# Patient Record
Sex: Male | Born: 1955 | Race: White | Hispanic: No | Marital: Single | Smoking: Never smoker
Health system: Southern US, Community
[De-identification: ages and names within clinical notes are randomized; demographics above are authoritative.]

## PROBLEM LIST (undated history)

## (undated) DIAGNOSIS — C61 Malignant neoplasm of prostate: Secondary | ICD-10-CM

## (undated) DIAGNOSIS — F32A Depression, unspecified: Secondary | ICD-10-CM

## (undated) DIAGNOSIS — I639 Cerebral infarction, unspecified: Secondary | ICD-10-CM

## (undated) DIAGNOSIS — T4145XA Adverse effect of unspecified anesthetic, initial encounter: Secondary | ICD-10-CM

## (undated) DIAGNOSIS — I6930 Unspecified sequelae of cerebral infarction: Secondary | ICD-10-CM

## (undated) DIAGNOSIS — J302 Other seasonal allergic rhinitis: Secondary | ICD-10-CM

## (undated) DIAGNOSIS — I8290 Acute embolism and thrombosis of unspecified vein: Secondary | ICD-10-CM

## (undated) DIAGNOSIS — I10 Essential (primary) hypertension: Secondary | ICD-10-CM

## (undated) DIAGNOSIS — F329 Major depressive disorder, single episode, unspecified: Secondary | ICD-10-CM

## (undated) DIAGNOSIS — N433 Hydrocele, unspecified: Secondary | ICD-10-CM

## (undated) DIAGNOSIS — Z8719 Personal history of other diseases of the digestive system: Secondary | ICD-10-CM

## (undated) DIAGNOSIS — F419 Anxiety disorder, unspecified: Secondary | ICD-10-CM

## (undated) DIAGNOSIS — M1990 Unspecified osteoarthritis, unspecified site: Secondary | ICD-10-CM

## (undated) DIAGNOSIS — T8859XA Other complications of anesthesia, initial encounter: Secondary | ICD-10-CM

## (undated) DIAGNOSIS — I69398 Other sequelae of cerebral infarction: Secondary | ICD-10-CM

## (undated) HISTORY — PX: NO PAST SURGERIES: SHX2092

---
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
## 2006-11-17 DIAGNOSIS — Z86718 Personal history of other venous thrombosis and embolism: Secondary | ICD-10-CM

## 2006-11-17 DIAGNOSIS — I69354 Hemiplegia and hemiparesis following cerebral infarction affecting left non-dominant side: Secondary | ICD-10-CM

## 2006-11-17 DIAGNOSIS — I69319 Unspecified symptoms and signs involving cognitive functions following cerebral infarction: Secondary | ICD-10-CM

## 2006-11-17 HISTORY — DX: Hemiplegia and hemiparesis following cerebral infarction affecting left non-dominant side: I69.354

## 2006-11-17 HISTORY — DX: Unspecified symptoms and signs involving cognitive functions following cerebral infarction: I69.319

## 2006-11-17 HISTORY — DX: Personal history of other venous thrombosis and embolism: Z86.718

## 2009-06-28 ENCOUNTER — Ambulatory Visit (HOSPITAL_COMMUNITY): Admission: RE | Admit: 2009-06-28 | Discharge: 2009-06-28 | Payer: Self-pay | Admitting: Family Medicine

## 2010-11-20 ENCOUNTER — Encounter
Admission: RE | Admit: 2010-11-20 | Discharge: 2010-11-20 | Payer: Self-pay | Source: Home / Self Care | Attending: Family Medicine | Admitting: Family Medicine

## 2012-09-23 ENCOUNTER — Encounter (HOSPITAL_COMMUNITY): Payer: Self-pay | Admitting: Emergency Medicine

## 2012-09-23 ENCOUNTER — Emergency Department (HOSPITAL_COMMUNITY)
Admission: EM | Admit: 2012-09-23 | Discharge: 2012-09-23 | Discharge status: Against Medical Advice | Payer: Medicare Other | Attending: Emergency Medicine | Admitting: Emergency Medicine

## 2012-09-23 DIAGNOSIS — R51 Headache: Secondary | ICD-10-CM | POA: Insufficient documentation

## 2012-09-23 DIAGNOSIS — M542 Cervicalgia: Secondary | ICD-10-CM | POA: Insufficient documentation

## 2012-09-23 HISTORY — DX: Cerebral infarction, unspecified: I63.9

## 2012-09-23 HISTORY — DX: Essential (primary) hypertension: I10

## 2012-09-23 NOTE — ED Notes (Signed)
Pt c/o HA and pressure behind eyes with pain into neck starting at noon today; pt with left sided weakness left over after CVA

## 2012-09-23 NOTE — ED Notes (Signed)
Pt has hx of stroke, c/o "tightening in my temple behind my eye." Denies any other sx at this time.

## 2016-06-17 DIAGNOSIS — Z87898 Personal history of other specified conditions: Secondary | ICD-10-CM

## 2016-06-17 DIAGNOSIS — Z8619 Personal history of other infectious and parasitic diseases: Secondary | ICD-10-CM

## 2016-06-17 HISTORY — DX: Personal history of other specified conditions: Z87.898

## 2016-06-17 HISTORY — DX: Personal history of other infectious and parasitic diseases: Z86.19

## 2016-06-28 ENCOUNTER — Encounter (HOSPITAL_COMMUNITY): Payer: Self-pay | Admitting: Internal Medicine

## 2016-06-28 ENCOUNTER — Inpatient Hospital Stay (HOSPITAL_COMMUNITY): Payer: Medicare Other

## 2016-06-28 ENCOUNTER — Emergency Department (HOSPITAL_COMMUNITY): Payer: Medicare Other

## 2016-06-28 ENCOUNTER — Inpatient Hospital Stay (HOSPITAL_COMMUNITY)
Admission: EM | Admit: 2016-06-28 | Discharge: 2016-07-03 | DRG: 871 | Disposition: A | Discharge status: Skilled Nursing Facility | Payer: Medicare Other | Attending: Internal Medicine | Admitting: Internal Medicine

## 2016-06-28 DIAGNOSIS — I10 Essential (primary) hypertension: Secondary | ICD-10-CM | POA: Diagnosis present

## 2016-06-28 DIAGNOSIS — F32A Depression, unspecified: Secondary | ICD-10-CM | POA: Diagnosis present

## 2016-06-28 DIAGNOSIS — I639 Cerebral infarction, unspecified: Secondary | ICD-10-CM | POA: Diagnosis present

## 2016-06-28 DIAGNOSIS — K219 Gastro-esophageal reflux disease without esophagitis: Secondary | ICD-10-CM | POA: Diagnosis present

## 2016-06-28 DIAGNOSIS — F329 Major depressive disorder, single episode, unspecified: Secondary | ICD-10-CM | POA: Diagnosis present

## 2016-06-28 DIAGNOSIS — Z993 Dependence on wheelchair: Secondary | ICD-10-CM

## 2016-06-28 DIAGNOSIS — N179 Acute kidney failure, unspecified: Secondary | ICD-10-CM | POA: Diagnosis present

## 2016-06-28 DIAGNOSIS — G9340 Encephalopathy, unspecified: Secondary | ICD-10-CM | POA: Diagnosis present

## 2016-06-28 DIAGNOSIS — R4182 Altered mental status, unspecified: Secondary | ICD-10-CM | POA: Diagnosis not present

## 2016-06-28 DIAGNOSIS — N390 Urinary tract infection, site not specified: Secondary | ICD-10-CM | POA: Diagnosis present

## 2016-06-28 DIAGNOSIS — Z7401 Bed confinement status: Secondary | ICD-10-CM

## 2016-06-28 DIAGNOSIS — A419 Sepsis, unspecified organism: Principal | ICD-10-CM | POA: Diagnosis present

## 2016-06-28 DIAGNOSIS — F05 Delirium due to known physiological condition: Secondary | ICD-10-CM | POA: Diagnosis present

## 2016-06-28 DIAGNOSIS — Z79899 Other long term (current) drug therapy: Secondary | ICD-10-CM

## 2016-06-28 DIAGNOSIS — R21 Rash and other nonspecific skin eruption: Secondary | ICD-10-CM | POA: Diagnosis present

## 2016-06-28 DIAGNOSIS — W57XXXA Bitten or stung by nonvenomous insect and other nonvenomous arthropods, initial encounter: Secondary | ICD-10-CM | POA: Diagnosis present

## 2016-06-28 DIAGNOSIS — C61 Malignant neoplasm of prostate: Secondary | ICD-10-CM | POA: Diagnosis present

## 2016-06-28 DIAGNOSIS — E785 Hyperlipidemia, unspecified: Secondary | ICD-10-CM | POA: Diagnosis present

## 2016-06-28 DIAGNOSIS — I69354 Hemiplegia and hemiparesis following cerebral infarction affecting left non-dominant side: Secondary | ICD-10-CM

## 2016-06-28 DIAGNOSIS — R197 Diarrhea, unspecified: Secondary | ICD-10-CM | POA: Diagnosis not present

## 2016-06-28 DIAGNOSIS — A4151 Sepsis due to Escherichia coli [E. coli]: Secondary | ICD-10-CM | POA: Diagnosis not present

## 2016-06-28 DIAGNOSIS — I6300 Cerebral infarction due to thrombosis of unspecified precerebral artery: Secondary | ICD-10-CM

## 2016-06-28 DIAGNOSIS — F419 Anxiety disorder, unspecified: Secondary | ICD-10-CM | POA: Diagnosis present

## 2016-06-28 DIAGNOSIS — R0602 Shortness of breath: Secondary | ICD-10-CM

## 2016-06-28 DIAGNOSIS — F418 Other specified anxiety disorders: Secondary | ICD-10-CM | POA: Diagnosis not present

## 2016-06-28 LAB — CBC WITH DIFFERENTIAL/PLATELET
Basophils Absolute: 0 10*3/uL (ref 0.0–0.1)
Basophils Relative: 0 %
EOS ABS: 0 10*3/uL (ref 0.0–0.7)
EOS PCT: 0 %
HCT: 41.4 % (ref 39.0–52.0)
Hemoglobin: 14.5 g/dL (ref 13.0–17.0)
LYMPHS ABS: 1 10*3/uL (ref 0.7–4.0)
LYMPHS PCT: 6 %
MCH: 28.9 pg (ref 26.0–34.0)
MCHC: 35 g/dL (ref 30.0–36.0)
MCV: 82.5 fL (ref 78.0–100.0)
MONO ABS: 0.9 10*3/uL (ref 0.1–1.0)
MONOS PCT: 6 %
Neutro Abs: 13.3 10*3/uL — ABNORMAL HIGH (ref 1.7–7.7)
Neutrophils Relative %: 88 %
PLATELETS: 151 10*3/uL (ref 150–400)
RBC: 5.02 MIL/uL (ref 4.22–5.81)
RDW: 14 % (ref 11.5–15.5)
WBC: 15.3 10*3/uL — ABNORMAL HIGH (ref 4.0–10.5)

## 2016-06-28 LAB — COMPREHENSIVE METABOLIC PANEL
ALBUMIN: 4.8 g/dL (ref 3.5–5.0)
ALT: 21 U/L (ref 17–63)
AST: 23 U/L (ref 15–41)
Alkaline Phosphatase: 65 U/L (ref 38–126)
Anion gap: 13 (ref 5–15)
BILIRUBIN TOTAL: 1.6 mg/dL — AB (ref 0.3–1.2)
BUN: 23 mg/dL — AB (ref 6–20)
CHLORIDE: 103 mmol/L (ref 101–111)
CO2: 19 mmol/L — ABNORMAL LOW (ref 22–32)
CREATININE: 1.27 mg/dL — AB (ref 0.61–1.24)
Calcium: 9.4 mg/dL (ref 8.9–10.3)
GFR calc Af Amer: >60 mL/min (ref >60)
GFR calc non Af Amer: >60 mL/min (ref >60)
GLUCOSE: 131 mg/dL — AB (ref 65–99)
POTASSIUM: 3.9 mmol/L (ref 3.5–5.1)
Sodium: 135 mmol/L (ref 135–145)
Total Protein: 7.9 g/dL (ref 6.5–8.1)

## 2016-06-28 LAB — URINALYSIS, ROUTINE W REFLEX MICROSCOPIC
GLUCOSE, UA: NEGATIVE mg/dL
KETONES UR: 15 mg/dL — AB
Nitrite: POSITIVE — AB
PH: 5.5 (ref 5.0–8.0)
Protein, ur: >300 mg/dL — AB
Specific Gravity, Urine: 1.035 — ABNORMAL HIGH (ref 1.005–1.030)

## 2016-06-28 LAB — URINE MICROSCOPIC-ADD ON

## 2016-06-28 LAB — APTT: aPTT: 29 seconds (ref 24–36)

## 2016-06-28 LAB — PROTIME-INR
INR: 1.04
PROTHROMBIN TIME: 13.6 s (ref 11.4–15.2)

## 2016-06-28 LAB — PROCALCITONIN: PROCALCITONIN: 0.43 ng/mL

## 2016-06-28 LAB — LACTIC ACID, PLASMA
LACTIC ACID, VENOUS: 0.9 mmol/L (ref 0.5–1.9)
LACTIC ACID, VENOUS: 1.5 mmol/L (ref 0.5–1.9)

## 2016-06-28 LAB — I-STAT CG4 LACTIC ACID, ED: LACTIC ACID, VENOUS: 1.96 mmol/L — AB (ref 0.5–1.9)

## 2016-06-28 MED ORDER — HYDROCODONE-ACETAMINOPHEN 5-325 MG PO TABS
1.0000 | ORAL_TABLET | ORAL | Status: DC | PRN
  Administered 2016-06-28 – 2016-06-29 (×2): 1 via ORAL
  Filled 2016-06-28 (×2): qty 1

## 2016-06-28 MED ORDER — ALBUTEROL SULFATE (2.5 MG/3ML) 0.083% IN NEBU: 2.5000 mg | INHALATION_SOLUTION | RESPIRATORY_TRACT | Status: DC | PRN

## 2016-06-28 MED ORDER — ACETAMINOPHEN 650 MG RE SUPP
650.0000 mg | Freq: Four times a day (QID) | RECTAL | Status: DC | PRN
Start: 2016-06-28 — End: 2016-07-03

## 2016-06-28 MED ORDER — HEPARIN SODIUM (PORCINE) 5000 UNIT/ML IJ SOLN
5000.0000 [IU] | Freq: Three times a day (TID) | INTRAMUSCULAR | Status: DC
  Administered 2016-06-28 – 2016-06-29 (×2): 5000 [IU] via SUBCUTANEOUS
  Filled 2016-06-28 (×2): qty 1

## 2016-06-28 MED ORDER — LABETALOL HCL 200 MG PO TABS
200.0000 mg | ORAL_TABLET | Freq: Two times a day (BID) | ORAL | Status: DC
  Administered 2016-06-28 – 2016-07-03 (×10): 200 mg via ORAL
  Filled 2016-06-28 (×3): qty 1
  Filled 2016-06-28: qty 2
  Filled 2016-06-28 (×7): qty 1
  Filled 2016-06-28: qty 2

## 2016-06-28 MED ORDER — SODIUM CHLORIDE 0.9 % IV BOLUS (SEPSIS)
2000.0000 mL | Freq: Once | INTRAVENOUS | Status: AC
  Administered 2016-06-28: 2000 mL via INTRAVENOUS

## 2016-06-28 MED ORDER — SODIUM CHLORIDE 0.9 % IV SOLN
INTRAVENOUS | Status: AC
  Administered 2016-06-28: 16:00:00 via INTRAVENOUS

## 2016-06-28 MED ORDER — SODIUM CHLORIDE 0.9 % IV BOLUS (SEPSIS)
1000.0000 mL | Freq: Once | INTRAVENOUS | Status: AC
  Administered 2016-06-28: 1000 mL via INTRAVENOUS

## 2016-06-28 MED ORDER — HYDRALAZINE HCL 20 MG/ML IJ SOLN
10.0000 mg | Freq: Four times a day (QID) | INTRAMUSCULAR | Status: DC | PRN
  Administered 2016-07-02: 10 mg via INTRAVENOUS
  Filled 2016-06-28: qty 1

## 2016-06-28 MED ORDER — SODIUM CHLORIDE 0.9% FLUSH
3.0000 mL | Freq: Two times a day (BID) | INTRAVENOUS | Status: DC
  Administered 2016-06-28 – 2016-07-02 (×6): 3 mL via INTRAVENOUS

## 2016-06-28 MED ORDER — MINOXIDIL 2.5 MG PO TABS: 2.5000 mg | ORAL_TABLET | Freq: Two times a day (BID) | ORAL | Status: DC

## 2016-06-28 MED ORDER — CITALOPRAM HYDROBROMIDE 20 MG PO TABS
20.0000 mg | ORAL_TABLET | Freq: Every day | ORAL | Status: DC
  Administered 2016-06-28 – 2016-07-03 (×6): 20 mg via ORAL
  Filled 2016-06-28 (×2): qty 2
  Filled 2016-06-28 (×4): qty 1
  Filled 2016-06-28: qty 2

## 2016-06-28 MED ORDER — ALPRAZOLAM 0.25 MG PO TABS
0.2500 mg | ORAL_TABLET | Freq: Every day | ORAL | Status: DC | PRN
  Administered 2016-07-02: 0.25 mg via ORAL
  Filled 2016-06-28: qty 1

## 2016-06-28 MED ORDER — TAMSULOSIN HCL 0.4 MG PO CAPS
0.4000 mg | ORAL_CAPSULE | Freq: Every day | ORAL | Status: DC
  Administered 2016-06-28 – 2016-07-03 (×6): 0.4 mg via ORAL
  Filled 2016-06-28 (×6): qty 1

## 2016-06-28 MED ORDER — ONDANSETRON HCL 4 MG/2ML IJ SOLN: 4.0000 mg | Freq: Four times a day (QID) | INTRAMUSCULAR | Status: DC | PRN

## 2016-06-28 MED ORDER — PANTOPRAZOLE SODIUM 40 MG PO TBEC
40.0000 mg | DELAYED_RELEASE_TABLET | Freq: Every day | ORAL | Status: DC
  Administered 2016-06-28 – 2016-07-03 (×6): 40 mg via ORAL
  Filled 2016-06-28 (×6): qty 1

## 2016-06-28 MED ORDER — ONDANSETRON HCL 4 MG PO TABS: 4.0000 mg | ORAL_TABLET | Freq: Four times a day (QID) | ORAL | Status: DC | PRN

## 2016-06-28 MED ORDER — DOCUSATE SODIUM 100 MG PO CAPS
200.0000 mg | ORAL_CAPSULE | Freq: Every day | ORAL | Status: DC
  Administered 2016-06-29 – 2016-07-01 (×3): 200 mg via ORAL
  Filled 2016-06-28 (×5): qty 2

## 2016-06-28 MED ORDER — MINOXIDIL 2.5 MG PO TABS
2.5000 mg | ORAL_TABLET | Freq: Two times a day (BID) | ORAL | Status: DC
  Administered 2016-06-29 – 2016-07-03 (×9): 2.5 mg via ORAL
  Filled 2016-06-28 (×9): qty 1

## 2016-06-28 MED ORDER — MIRABEGRON ER 25 MG PO TB24: 25.0000 mg | ORAL_TABLET | Freq: Every day | ORAL | Status: DC

## 2016-06-28 MED ORDER — SIMVASTATIN 40 MG PO TABS
40.0000 mg | ORAL_TABLET | Freq: Every day | ORAL | Status: DC
  Administered 2016-06-28 – 2016-07-03 (×6): 40 mg via ORAL
  Filled 2016-06-28 (×6): qty 1

## 2016-06-28 MED ORDER — ACETAMINOPHEN 325 MG PO TABS
650.0000 mg | ORAL_TABLET | Freq: Four times a day (QID) | ORAL | Status: DC | PRN
  Administered 2016-06-28 – 2016-07-01 (×7): 650 mg via ORAL
  Filled 2016-06-28 (×7): qty 2

## 2016-06-28 MED ORDER — DEXTROSE 5 % IV SOLN
2.0000 g | Freq: Once | INTRAVENOUS | Status: AC
  Administered 2016-06-28: 2 g via INTRAVENOUS
  Filled 2016-06-28: qty 2

## 2016-06-28 MED ORDER — CEFTRIAXONE SODIUM 1 G IJ SOLR
1.0000 g | INTRAMUSCULAR | Status: DC
Start: 2016-06-29 — End: 2016-07-01
  Administered 2016-06-29 – 2016-07-01 (×3): 1 g via INTRAVENOUS
  Filled 2016-06-28 (×3): qty 10

## 2016-06-28 NOTE — ED Notes (Signed)
MD at bedside. EDP PRESENT 

## 2016-06-28 NOTE — ED Notes (Signed)
Attempt for 2 nd IV not successful- will make floor aware

## 2016-06-28 NOTE — ED Notes (Signed)
Bed: WA01 Expected date:  Expected time:  Means of arrival:  Comments:  60 yo UTI?, AMS

## 2016-06-28 NOTE — H&P (Signed)
TRH H&P   Patient Demographics:    Bryan Bartlett, is a 60 y.o. male  MRN: [id number]   DOB - 1956-04-01  Admit Date - 06/28/2016  Outpatient Primary MD for the patient is No primary care provider on file.  Outpatient Specialists: [facility]  Patient coming from: Home  Chief Complaint  Patient presents with  . Urinary Tract Infection  . Fever  . Altered Mental Status      HPI:    Bryan Bartlett  is a 60 y.o. 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  The ER diagnosed with sepsis due to UTI, patient's review of systems besides above is entirely unremarkable, I was called to admit him for sepsis treatment.    Review of systems:    In addition to the HPI above,   Positive Fever-chills, No Headache, No changes with Vision or hearing, No problems swallowing food or Liquids, No Chest pain, Cough or Shortness of Breath, No Abdominal pain, No Nausea or Vommitting, Bowel movements are regular, No Blood in stool or Urine, Positive dysuria, No new skin rashes or bruises, No new joints pains-aches,  No new weakness, tingling, numbness in any extremity, No recent weight gain or loss, No polydypsia or polyphagia, No significant Mental Stressors.  A full 10 point Review of Systems was done, except as stated above, all other Review of Systems were negative.   With Past History of the following :     Past Medical History:  Diagnosis Date  . Hypertension   . Stroke [facility])       No past surgical history on file.    Social History:     Social History  Substance Use Topics  . Smoking status: Never Smoker  . Smokeless tobacco: Not on file  . Alcohol use No         Family History :   No history of prostate cancer   Home Medications:   Prior to Admission medications   Medication Sig Start Date End Date Taking? Authorizing Provider  ALPRAZolam Duanne Moron) 0.5 MG tablet Take 0.25  mg by mouth daily as needed for anxiety.  05/27/16  Yes Historical Provider, MD  cetirizine (ZYRTEC) 10 MG tablet Take 10 mg by mouth daily.   Yes Historical Provider, MD  citalopram (CELEXA) 20 MG tablet Take 20 mg by mouth daily. 05/27/16  Yes Historical Provider, MD  Docusate Calcium (STOOL SOFTENER PO) Take 1 tablet by mouth daily.   Yes Historical Provider, MD  furosemide (LASIX) 20 MG tablet Take 20 mg by mouth daily. 05/27/16  Yes Historical Provider, MD  labetalol (NORMODYNE) 200 MG tablet Take 400 mg by mouth 2 (two) times daily. 06/13/16  Yes Historical Provider, MD  lisinopril (PRINIVIL,ZESTRIL) 40 MG tablet Take 80 mg by mouth daily. 06/02/16  Yes Historical Provider, MD  methylphenidate (RITALIN) 10 MG tablet Take 10 mg by mouth 2 (two) times daily. 06/24/16  Yes Historical Provider, MD  minoxidil (LONITEN) 10 MG tablet Take 10 mg by mouth 2 (two) times daily. 05/27/16  Yes Historical Provider, MD  Multiple Vitamins-Minerals (MULTIVITAMIN ADULT PO) Take 1 tablet by mouth daily.   Yes Historical Provider, MD  MYRBETRIQ 25 MG TB24 tablet Take 25 mg by mouth daily. 06/09/16  Yes Historical Provider, MD  omeprazole (PRILOSEC) 20 MG capsule Take 20 mg by mouth daily. 06/07/16  Yes Historical Provider, MD  simvastatin (ZOCOR) 40 MG tablet Take 40 mg by mouth daily. 06/25/16  Yes Historical Provider, MD     Allergies:    No Known Allergies   Physical Exam:   Vitals  Blood pressure 115/76, pulse 93,  temperature 102.1 F (38.9 C), temperature source Rectal, resp. rate 22, height 5\' 8"  (1.727 m), weight 65.3 kg (144 lb), SpO2 100 %.   1. General Is an middle-aged white male lying in bed in NAD,    2. Normal affect and insight, Not Suicidal or Homicidal, Awake Alert, Oriented X 3.  3. No F.N deficits, ALL C.Nerves Intact, Strength 5/5 in right-sided extremities, strength 2/5 on the left side, Sensation intact all 4 extremities, Plantars down going on the right.  4. Ears and Eyes appear Normal, Conjunctivae clear, PERRLA. Moist Oral Mucosa.  5. Supple Neck, No JVD, No cervical lymphadenopathy appriciated, No Carotid Bruits.  6. Symmetrical Chest wall movement, Good air movement bilaterally, CTAB.  7. RRR, No Gallops, Rubs or Murmurs, No Parasternal Heave.  8. Positive Bowel Sounds, Abdomen Soft, No tenderness, No organomegaly appriciated,No rebound -guarding or rigidity.  9.  No Cyanosis, Normal Skin Turgor, No Skin Rash or Bruise.  10. Good muscle tone,  joints appear normal , no effusions, Normal ROM.  11. No Palpable Lymph Nodes in Neck or Axillae      Data Review:    CBC  Recent Labs Lab 06/28/16 1115  WBC 15.3*  HGB 14.5  HCT 41.4  PLT 151  MCV 82.5  MCH 28.9  MCHC 35.0  RDW 14.0  LYMPHSABS 1.0  MONOABS 0.9  EOSABS 0.0  BASOSABS 0.0   ------------------------------------------------------------------------------------------------------------------  Chemistries   Recent Labs Lab 06/28/16 1115  NA 135  K 3.9  CL 103  CO2 19*  GLUCOSE 131*  BUN 23*  CREATININE 1.27*  CALCIUM 9.4  AST 23  ALT 21  ALKPHOS 65  BILITOT 1.6*   ------------------------------------------------------------------------------------------------------------------ estimated creatinine clearance is 57.8 mL/min (by C-G formula based on SCr of 1.27 mg/dL). ------------------------------------------------------------------------------------------------------------------ No  results for input(s): TSH, T4TOTAL, T3FREE, THYROIDAB in the last 72 hours.  Invalid input(s): FREET3  Coagulation profile No results for input(s): INR, PROTIME in the  last 168 hours. ------------------------------------------------------------------------------------------------------------------- No results for input(s): DDIMER in the last 72 hours. -------------------------------------------------------------------------------------------------------------------  Cardiac Enzymes No results for input(s): CKMB, TROPONINI, MYOGLOBIN in the last 168 hours.  Invalid input(s): CK ------------------------------------------------------------------------------------------------------------------ No results found for: BNP   ---------------------------------------------------------------------------------------------------------------  Urinalysis    Component Value Date/Time   COLORURINE RED (A) 06/28/2016 1310   APPEARANCEUR TURBID (A) 06/28/2016 1310   LABSPEC 1.035 (H) 06/28/2016 1310   PHURINE 5.5 06/28/2016 1310   GLUCOSEU NEGATIVE 06/28/2016 1310   HGBUR LARGE (A) 06/28/2016 1310   BILIRUBINUR MODERATE (A) 06/28/2016 1310   KETONESUR 15 (A) 06/28/2016 1310   PROTEINUR >300 (A) 06/28/2016 1310   NITRITE POSITIVE (A) 06/28/2016 1310   LEUKOCYTESUR MODERATE (A) 06/28/2016 1310    ----------------------------------------------------------------------------------------------------------------   Imaging Results:    Dg Chest 2 View  Result Date: 06/28/2016 CLINICAL DATA:  Per [facility]- Pt resides at home. Pt presents with altered mental status- not normal. Pt with UTI symptoms started yesterday. HX of over active bladder, Frequency, hematuria dysuria This am. Fever 101.1 tympanic for EMS. EXAM: CHEST  2 VIEW COMPARISON:  None. FINDINGS: Cardiac silhouette is mildly enlarged. No mediastinal or hilar masses.  No evidence of adenopathy. Lung volumes are low. Allowing for this, lungs are  clear. No pleural effusion or pneumothorax. Skeletal structures are unremarkable. IMPRESSION: No active cardiopulmonary disease. Electronically Signed   By: Lajean Manes M.D.   On: 06/28/2016 12:18    Baseline EKG ordered   Assessment & Plan:     1. Sepsis due to UTI in a patient with prostate cancer being managed conservatively. Will be admitted to telemetry bed, sepsis protocol initiated, cultures drawn, IV fluid bolus and maintenance, will place him empirically on Flomax, IV Rocephin and then guided by cultures. Post void bladder scan in the ER was 100 mL, will repeat renal ultrasound later to rule out any ongoing urinary retention.  2. Prostate cancer. Under the care of [facility] in control. Outpatient follow-up with them after discharge.  3. Severe essential hypertension. Blood pressure currently soft, will give him half home dose labetalol from this evening with holding parameters, one fourth does office minoxidil from tomorrow, hold Prinivil, as needed IV hydralazine, reevaluate blood pressure medications in the morning.  4. CVA with left-sided hemiparesis. Continue statin for secondary prevention, supportive care. He is largely wheelchair and bedbound.  5. Dyslipidemia. Continue statin.  6. Anxiety and depression. Celexa and Xanax continued.  7. Stable mild allergy with small skin rash on the left thigh with my Bertie. Stop.  8. GERD. On PPI.    DVT Prophylaxis Heparin    AM Labs Ordered, also please review Full Orders  Family Communication: Admission, patients condition and plan of care including tests being ordered have been discussed with the patient and family who indicate understanding and agree with the plan and Code Status.  Code Status Full  Likely DC to  Home 1-2 days  Condition GUARDED    Consults called: None    Admission status: Inpt    Time spent in minutes : 35   SINGH,PRASHANT K M.D on 06/28/2016 at 2:49 PM  Between 7am to 7pm - Pager -  [phone]. After 7pm go to [email or web address] - password [facility]  [facility] - Office  [phone]

## 2016-06-28 NOTE — Progress Notes (Signed)
Pharmacy Antibiotic Note  Bryan Bartlett is a 60 y.o. male admitted on 06/28/2016 with UTI.  Pharmacy has been consulted for ceftriaxone dosing.  Plan: Ceftriaxone 2gm IV x 1 in ED then Ceftriaxone 1gm IV q24h No renal adjustment required, pharmacy will sign off Please re-consult if needed  Height: 5\' 8"  (172.7 cm) Weight: 144 lb (65.3 kg) IBW/kg (Calculated) : 68.4  Temp (24hrs), Avg:102.1 F (38.9 C), Min:102.1 F (38.9 C), Max:102.1 F (38.9 C)   Recent Labs Lab 06/28/16 1115 06/28/16 1124  WBC 15.3*  --   LATICACIDVEN  --  1.96*    CrCl cannot be calculated (No order found.).    No Known Allergies  Antimicrobials this admission: 8/12 ceftriaxone >>  Microbiology results: 8/12 BCx:  8/12 UCx:  Thank you for allowing pharmacy to be a part of this patient's care.  Dolly Rias RPh 06/28/2016, 12:08 PM Pager [phone]

## 2016-06-28 NOTE — ED Notes (Signed)
Admission MD present. Family present

## 2016-06-28 NOTE — ED Notes (Signed)
Renal Ultrasound at bedside. Delay in transfer

## 2016-06-28 NOTE — ED Notes (Signed)
EKG PERFORMED PER ORDER

## 2016-06-28 NOTE — ED Notes (Signed)
Condom cath- placed [place] NT for comfort

## 2016-06-28 NOTE — ED Notes (Signed)
Bryan [id number] [phone] cousin

## 2016-06-28 NOTE — ED Notes (Addendum)
Blood culture x 1 obtain 11:20 32ml LAC

## 2016-06-28 NOTE — ED Notes (Signed)
Attempted to in and out cath, was unsuccessful

## 2016-06-28 NOTE — ED Triage Notes (Signed)
Per [facility]- Pt resides at home. Pt presents with altered mental status- not normal. Pt with UTI symptoms started yesterday. HX of over active bladder, Frequency,  hematuria dysuria  This am. Fever 101.1 tympanic for EMS. Tylenol 1000mg  PO. Given on scene. Rash to groin. Benadryl 50mg  PO on scene

## 2016-06-28 NOTE — ED Provider Notes (Signed)
[facility] DEPT Provider Note   CSN: [id number] Arrival date & time: 06/28/16  1018  First Provider Contact:  None       History   Chief Complaint Chief Complaint  Patient presents with  . Urinary Tract Infection  . Fever  . Altered Mental Status    HPI Bryan Bartlett is a 60 y.o. male.  60 year old male with history of stroke as well as UTIs presents with dysuria and confusion 1 day. Similar symptoms occur when he's had a UTI. Called EMS and temperature was 101 and he was medicated with Tylenol 1 g. Denies any cough or congestion. No vomiting or diarrhea. No abdominal or chest discomfort. No flank pain. Does note some suprapubic tenderness. He does wear a diaper. Has had a rash to his left anterior thigh which is been pleuritic and related systems starting a new medications. He denies any trouble swallowing      Past Medical History:  Diagnosis Date  . Hypertension   . Stroke     There are no active problems to display for this patient.   No past surgical history on file.     Home Medications    Prior to Admission medications   Not on File    Family History No family history on file.  Social History Social History  Substance Use Topics  . Smoking status: Never Smoker  . Smokeless tobacco: Not on file  . Alcohol use No     Allergies   Review of patient's allergies indicates no known allergies.   Review of Systems Review of Systems  All other systems reviewed and are negative.    Physical Exam Updated Vital Signs SpO2 99%   Physical Exam  Constitutional: He is oriented to person, place, and time. He appears well-developed and well-nourished.  Non-toxic appearance. No distress.  HENT:  Head: Normocephalic and atraumatic.  Eyes: Conjunctivae, EOM and lids are normal. Pupils are equal, round, and reactive to light.  Neck: Normal range of motion. Neck supple. No tracheal deviation present. No thyroid mass present.  Cardiovascular:  Normal rate, regular rhythm and normal heart sounds.  Exam reveals no gallop.   No murmur heard. Pulmonary/Chest: Effort normal and breath sounds normal. No stridor. No respiratory distress. He has no decreased breath sounds. He has no wheezes. He has no rhonchi. He has no rales.  Abdominal: Soft. Normal appearance and bowel sounds are normal. He exhibits no distension. There is tenderness in the suprapubic area. There is no rigidity, no rebound, no guarding and no CVA tenderness.  Musculoskeletal: Normal range of motion. He exhibits no edema or tenderness.  Neurological: He is alert and oriented to person, place, and time. He displays no tremor. No cranial nerve deficit or sensory deficit. He displays no seizure activity. GCS eye subscore is 4. GCS verbal subscore is 5. GCS motor subscore is 6.  Skin: Skin is warm and dry. No abrasion and no rash noted.  Psychiatric: His speech is normal and behavior is normal. His affect is blunt.  Nursing note and vitals reviewed.    ED Treatments / Results  Labs (all labs ordered are listed, but only abnormal results are displayed) Labs Reviewed  CULTURE, BLOOD (ROUTINE X 2)  CULTURE, BLOOD (ROUTINE X 2)  URINE CULTURE  COMPREHENSIVE METABOLIC PANEL  CBC WITH DIFFERENTIAL/PLATELET  URINALYSIS, ROUTINE W REFLEX MICROSCOPIC (NOT AT [facility])  I-STAT CG4 LACTIC ACID, ED    EKG  EKG Interpretation None  Radiology No results found.  Procedures Procedures (including critical care time)  Medications Ordered in ED Medications - No data to display   Initial Impression / Assessment and Plan / ED Course  I have reviewed the triage vital signs and the nursing notes.  Pertinent labs & imaging results that were available during my care of the patient were reviewed by me and considered in my medical decision making (see chart for details).  Clinical Course    Patient started on IV antibiotics for his urinary tract infection. Given IV fluids as  well 2. Will be admitted to the telemetry service by the hospitalist  Final Clinical Impressions(s) / ED Diagnoses   Final diagnoses:  None    New Prescriptions New Prescriptions   No medications on file     Lacretia Leigh, MD 06/28/16 1453

## 2016-06-28 NOTE — ED Notes (Signed)
No medications on this floor for this pt

## 2016-06-29 DIAGNOSIS — K219 Gastro-esophageal reflux disease without esophagitis: Secondary | ICD-10-CM | POA: Diagnosis present

## 2016-06-29 DIAGNOSIS — I6300 Cerebral infarction due to thrombosis of unspecified precerebral artery: Secondary | ICD-10-CM

## 2016-06-29 DIAGNOSIS — F418 Other specified anxiety disorders: Secondary | ICD-10-CM

## 2016-06-29 DIAGNOSIS — C61 Malignant neoplasm of prostate: Secondary | ICD-10-CM

## 2016-06-29 DIAGNOSIS — I10 Essential (primary) hypertension: Secondary | ICD-10-CM

## 2016-06-29 DIAGNOSIS — F32A Depression, unspecified: Secondary | ICD-10-CM | POA: Diagnosis present

## 2016-06-29 DIAGNOSIS — F419 Anxiety disorder, unspecified: Secondary | ICD-10-CM

## 2016-06-29 DIAGNOSIS — E785 Hyperlipidemia, unspecified: Secondary | ICD-10-CM

## 2016-06-29 DIAGNOSIS — F329 Major depressive disorder, single episode, unspecified: Secondary | ICD-10-CM | POA: Diagnosis present

## 2016-06-29 LAB — CBC
HEMATOCRIT: 35.6 % — AB (ref 39.0–52.0)
HEMOGLOBIN: 12.6 g/dL — AB (ref 13.0–17.0)
MCH: 29.5 pg (ref 26.0–34.0)
MCHC: 35.4 g/dL (ref 30.0–36.0)
MCV: 83.4 fL (ref 78.0–100.0)
Platelets: 132 10*3/uL — ABNORMAL LOW (ref 150–400)
RBC: 4.27 MIL/uL (ref 4.22–5.81)
RDW: 14.5 % (ref 11.5–15.5)
WBC: 18.8 10*3/uL — ABNORMAL HIGH (ref 4.0–10.5)

## 2016-06-29 LAB — BASIC METABOLIC PANEL
ANION GAP: 8 (ref 5–15)
BUN: 18 mg/dL (ref 6–20)
CO2: 22 mmol/L (ref 22–32)
Calcium: 7.9 mg/dL — ABNORMAL LOW (ref 8.9–10.3)
Chloride: 108 mmol/L (ref 101–111)
Creatinine, Ser: 1.14 mg/dL (ref 0.61–1.24)
GFR calc non Af Amer: >60 mL/min (ref >60)
GLUCOSE: 106 mg/dL — AB (ref 65–99)
POTASSIUM: 3.9 mmol/L (ref 3.5–5.1)
Sodium: 138 mmol/L (ref 135–145)

## 2016-06-29 LAB — MAGNESIUM: Magnesium: 1.4 mg/dL — ABNORMAL LOW (ref 1.7–2.4)

## 2016-06-29 MED ORDER — HYDROCORTISONE 1 % EX CREA
TOPICAL_CREAM | Freq: Three times a day (TID) | CUTANEOUS | Status: DC
  Administered 2016-06-29 – 2016-07-03 (×12): via TOPICAL
  Filled 2016-06-29: qty 28

## 2016-06-29 MED ORDER — ENOXAPARIN SODIUM 40 MG/0.4ML ~~LOC~~ SOLN
40.0000 mg | SUBCUTANEOUS | Status: DC
  Administered 2016-06-29 – 2016-07-02 (×4): 40 mg via SUBCUTANEOUS
  Filled 2016-06-29 (×4): qty 0.4

## 2016-06-29 NOTE — Progress Notes (Signed)
Patient's sister states patient's urologist is Dr Baruch Gouty with [facility]. Eulas Post, RN

## 2016-06-29 NOTE — Progress Notes (Addendum)
Progress Note    Bryan Bartlett  [id number] DOB: September 04, 1956  DOA: 06/28/2016 PCP: No primary care provider on file.    Brief Narrative:   Bryan Bartlett is an 60 y.o. male with a PMH of CVA/left hemiparesis and wheelchair-bound status, prostate cancer which is being managed conservatively with monitoring, poorly controlled high blood pressure, anxiety, dyslipidemia and GERD who presented to the hospital 06/28/16 for evaluation of dysuria, fever and chills. Patient was found to have sepsis secondary to UTI.  Assessment/Plan:   Principal Problem:   Sepsis ([place]) secondary to UTI complicated by urinary retention in the setting of prostate cancer Sepsis order set utilized. The patient received appropriate IV fluids with bolus in the ED. Serum lactate initially elevated, cleared with IV fluids. Urine culture/blood culture sent. IV Rocephin empirically started. Flomax was also started to address his urinary retention. He was noted to have a post void bladder residual of 100 mL. Renal ultrasound negative for hydronephrosis. He will need to follow-up with urologist post discharge.  Active Problems:   Poorly controlled essential hypertension The patient's blood pressure was soft on admission, consistent with sepsis. Blood pressure has improved with fluid volume resuscitation. Gradually resume antihypertensives.    History of CVA with left-sided hemiparesis Continue secondary prevention.    Dyslipidemia Continue statin.    Depression and anxiety Continue Celexa and Xanax.    GERD Continue PPI.  Family Communication/Anticipated D/C date and plan/Code Status   DVT prophylaxis: Lovenox ordered. Code Status: Full Code.  Family Communication: No family at bedside. Disposition Plan: Home in 48-72 hours.   Medical Consultants:    None.   Procedures:    None  Anti-Infectives:   Rocephin 06/28/16--->  Subjective:    Bryan Bartlett has had some nausea but no  vomiting, no complaints of pain, and no dyspnea. He continues to have fevers.  Objective:    Vitals:   06/28/16 1745 06/28/16 2039 06/29/16 0018 06/29/16 0432  BP: 140/62 140/68 132/70 125/72  Pulse: (!) 104 (!) 112  99  Resp: (!) 24 18 18 18   Temp: (!) 103.4 F (39.7 C) (!) 101.8 F (38.8 C) 98.7 F (37.1 C) (!) 100.5 F (38.1 C)  TempSrc: Rectal Oral Oral Oral  SpO2: 97% 97% 97% 96%  Weight:    89.4 kg (197 lb)  Height:        Intake/Output Summary (Last 24 hours) at 06/29/16 0852 Last data filed at 06/29/16 0645  Gross per 24 hour  Intake           8362.5 ml  Output              275 ml  Net           8087.5 ml   Filed Weights   06/28/16 1043 06/29/16 0432  Weight: 65.3 kg (144 lb) 89.4 kg (197 lb)    Exam: General exam: Appears calm and comfortable.  Respiratory system: Clear to auscultation. Respiratory effort normal. Cardiovascular system: S1 & S2 heard, Tachycardic. No JVD,  rubs, gallops or clicks. No murmurs. Gastrointestinal system: Abdomen is nondistended, soft and nontender. No organomegaly or masses felt. Normal bowel sounds heard. Central nervous system: Alert and oriented. No focal neurological deficits. Extremities: No clubbing,  or cyanosis. No edema. Skin: No rashes, lesions or ulcers. Psychiatry: Judgement and insight appear normal. Mood & affect appropriate.   Data Reviewed:   I have personally reviewed following labs and imaging studies:  Labs: Basic  Metabolic Panel:  Recent Labs Lab 06/28/16 1115 06/29/16 0454  NA 135 138  K 3.9 3.9  CL 103 108  CO2 19* 22  GLUCOSE 131* 106*  BUN 23* 18  CREATININE 1.27* 1.14  CALCIUM 9.4 7.9*  MG  --  1.4*   GFR Estimated Creatinine Clearance: 75.8 mL/min (by C-G formula based on SCr of 1.14 mg/dL). Liver Function Tests:  Recent Labs Lab 06/28/16 1115  AST 23  ALT 21  ALKPHOS 65  BILITOT 1.6*  PROT 7.9  ALBUMIN 4.8   No results for input(s): LIPASE, AMYLASE in the last 168 hours. No  results for input(s): AMMONIA in the last 168 hours. Coagulation profile  Recent Labs Lab 06/28/16 1115  INR 1.04    CBC:  Recent Labs Lab 06/28/16 1115 06/29/16 0454  WBC 15.3* 18.8*  NEUTROABS 13.3*  --   HGB 14.5 12.6*  HCT 41.4 35.6*  MCV 82.5 83.4  PLT 151 132*   Sepsis Labs:  Recent Labs Lab 06/28/16 1115 06/28/16 1124 06/28/16 1644 06/28/16 1847 06/29/16 0454  PROCALCITON 0.43  --   --   --   --   WBC 15.3*  --   --   --  18.8*  LATICACIDVEN  --  1.96* 0.9 1.5  --     Microbiology Recent Results (from the past 240 hour(s))  Blood Culture (routine x 2)     Status: None (Preliminary result)   Collection Time: 06/28/16 11:15 AM  Result Value Ref Range Status   Specimen Description   Final    BLOOD RIGHT ANTECUBITAL Performed at [facility]    Special Requests NONE  Final   Culture PENDING  Incomplete   Report Status PENDING  Incomplete  Blood Culture (routine x 2)     Status: None (Preliminary result)   Collection Time: 06/28/16 11:20 AM  Result Value Ref Range Status   Specimen Description   Final    BLOOD LEFT ANTECUBITAL Performed at [facility]    Special Requests NONE  Final   Culture PENDING  Incomplete   Report Status PENDING  Incomplete    Radiology: Dg Chest 2 View  Result Date: 06/28/2016 CLINICAL DATA:  Per [facility]- Pt resides at home. Pt presents with altered mental status- not normal. Pt with UTI symptoms started yesterday. HX of over active bladder, Frequency, hematuria dysuria This am. Fever 101.1 tympanic for EMS. EXAM: CHEST  2 VIEW COMPARISON:  None. FINDINGS: Cardiac silhouette is mildly enlarged. No mediastinal or hilar masses.  No evidence of adenopathy. Lung volumes are low. Allowing for this, lungs are clear. No pleural effusion or pneumothorax. Skeletal structures are unremarkable. IMPRESSION: No active cardiopulmonary disease. Electronically Signed   By: Lajean Manes M.D.   On: 06/28/2016 12:18   US  Renal  Result Date: 06/28/2016 CLINICAL DATA:  Acute renal failure EXAM: RENAL / URINARY TRACT ULTRASOUND COMPLETE COMPARISON:  None. FINDINGS: Right Kidney: Length: 10.3 cm. Echogenicity within normal limits. No mass or hydronephrosis visualized. Left Kidney: Length: 12.4 cm. Echogenicity within normal limits. No mass or hydronephrosis visualized. Bladder: Appears normal for degree of bladder distention. IMPRESSION: No acute abnormality noted. Electronically Signed   By: Inez Catalina M.D.   On: 06/28/2016 17:55    Medications:   . cefTRIAXone (ROCEPHIN)  IV  1 g Intravenous Q24H  . citalopram  20 mg Oral Daily  . docusate sodium  200 mg Oral Daily  . heparin  5,000 Units Subcutaneous Q8H  .  labetalol  200 mg Oral BID  . minoxidil  2.5 mg Oral BID  . pantoprazole  40 mg Oral Daily  . simvastatin  40 mg Oral Daily  . sodium chloride flush  3 mL Intravenous Q12H  . tamsulosin  0.4 mg Oral Daily   Continuous Infusions: . sodium chloride 125 mL/hr at 06/28/16 2300    Time spent: 35 minutes.  The patient is medically complex with multiple co-morbidities and is at high risk for clinical deterioration and requires high complexity decision making.    LOS: 1 day   Carlen Rebuck  [facility] Pager [phone]. If unable to reach me by pager, please call my cell phone at [phone].  *Please refer to [email or web address], password [email or web address] to get updated schedule on who will round on this patient, as hospitalists switch teams weekly. If 7PM-7AM, please contact night-coverage at [email or web address], password [email or web address] for any overnight needs.  06/29/2016, 8:52 AM

## 2016-06-30 DIAGNOSIS — W57XXXA Bitten or stung by nonvenomous insect and other nonvenomous arthropods, initial encounter: Secondary | ICD-10-CM

## 2016-06-30 LAB — BASIC METABOLIC PANEL
ANION GAP: 6 (ref 5–15)
BUN: 17 mg/dL (ref 6–20)
CHLORIDE: 108 mmol/L (ref 101–111)
CO2: 21 mmol/L — AB (ref 22–32)
Calcium: 8.1 mg/dL — ABNORMAL LOW (ref 8.9–10.3)
Creatinine, Ser: 1.05 mg/dL (ref 0.61–1.24)
GFR calc Af Amer: >60 mL/min (ref >60)
GFR calc non Af Amer: >60 mL/min (ref >60)
GLUCOSE: 116 mg/dL — AB (ref 65–99)
POTASSIUM: 3.5 mmol/L (ref 3.5–5.1)
Sodium: 135 mmol/L (ref 135–145)

## 2016-06-30 LAB — CBC
HEMATOCRIT: 35.4 % — AB (ref 39.0–52.0)
HEMOGLOBIN: 12.4 g/dL — AB (ref 13.0–17.0)
MCH: 29 pg (ref 26.0–34.0)
MCHC: 35 g/dL (ref 30.0–36.0)
MCV: 82.7 fL (ref 78.0–100.0)
PLATELETS: 122 10*3/uL — AB (ref 150–400)
RBC: 4.28 MIL/uL (ref 4.22–5.81)
RDW: 14.6 % (ref 11.5–15.5)
WBC: 16.1 10*3/uL — ABNORMAL HIGH (ref 4.0–10.5)

## 2016-06-30 LAB — URINE CULTURE

## 2016-06-30 MED ORDER — MAGNESIUM SULFATE 2 GM/50ML IV SOLN
2.0000 g | Freq: Once | INTRAVENOUS | Status: AC
  Administered 2016-06-30: 2 g via INTRAVENOUS
  Filled 2016-06-30: qty 50

## 2016-06-30 MED ORDER — DOXYCYCLINE HYCLATE 100 MG PO TABS
100.0000 mg | ORAL_TABLET | Freq: Two times a day (BID) | ORAL | Status: DC
  Administered 2016-06-30 – 2016-07-02 (×5): 100 mg via ORAL
  Filled 2016-06-30 (×5): qty 1

## 2016-06-30 MED ORDER — POLYVINYL ALCOHOL 1.4 % OP SOLN
1.0000 [drp] | OPHTHALMIC | Status: DC | PRN
  Filled 2016-06-30: qty 15

## 2016-06-30 NOTE — Progress Notes (Signed)
Progress Note    Bryan Bartlett  [id number] DOB: 10-17-1956  DOA: 06/28/2016 PCP: No primary care provider on file.    Brief Narrative:   Bryan Bartlett is an 60 y.o. male with a PMH of CVA/left hemiparesis and wheelchair-bound status, prostate cancer which is being managed conservatively with monitoring, poorly controlled high blood pressure, anxiety, dyslipidemia and GERD who presented to the hospital 06/28/16 for evaluation of dysuria, fever and chills. Patient was found to have sepsis secondary to UTI.  Assessment/Plan:   Principal Problem:   Sepsis ([place]) secondary to UTI complicated by urinary retention in the setting of prostate cancer Sepsis order set utilized. The patient received appropriate IV fluids with bolus in the ED. Serum lactate initially elevated, cleared with IV fluids. Urine culture/blood culture sent. IV Rocephin empirically started. Flomax was also started to address his urinary retention. He was noted to have a post void bladder residual of 100 mL. Renal ultrasound negative for hydronephrosis. He will need to follow-up with urologist post discharge.    Tick bite Given ongoing fevers, will add doxycycline and check [facility] spotted fever titers and Lyme disease PCR studies.  Active Problems:   Poorly controlled essential hypertension The patient's blood pressure was soft on admission, consistent with sepsis. Blood pressure has improved with fluid volume resuscitation. Gradually resume antihypertensives.    History of CVA with left-sided hemiparesis Continue secondary prevention.    Dyslipidemia Continue statin.    Depression and anxiety Continue Celexa and Xanax.    GERD Continue PPI.  Family Communication/Anticipated D/C date and plan/Code Status   DVT prophylaxis: Lovenox ordered. Code Status: Full Code.  Family Communication: No family at bedside. Disposition Plan: Home when his fever resolves and his antibiotics can successfully  be transitioned to oral, likely another 48-72 hours.   Medical Consultants:    None   Procedures:    None  Anti-Infectives:   Rocephin 06/28/16--->  Subjective:    Bryan Bartlett has had some nausea but no vomiting, no complaints of pain, and no dyspnea. He continues to have fevers.  Objective:    Vitals:   06/29/16 2139 06/30/16 0506 06/30/16 0900 06/30/16 1000  BP:  137/81  (!) (P) 157/86  Pulse:  90  (P) 90  Resp:  18    Temp: (!) 100.7 F (38.2 C) (!) 100.8 F (38.2 C) (!) 101.1 F (38.4 C)   TempSrc: Rectal Rectal Oral   SpO2:  97%    Weight:  90.5 kg (199 lb 9.6 oz)    Height:        Intake/Output Summary (Last 24 hours) at 06/30/16 1052 Last data filed at 06/30/16 0523  Gross per 24 hour  Intake               50 ml  Output             1550 ml  Net            -1500 ml   Filed Weights   06/28/16 1043 06/29/16 0432 06/30/16 0506  Weight: 65.3 kg (144 lb) 89.4 kg (197 lb) 90.5 kg (199 lb 9.6 oz)    Exam: General exam: Appears calm and comfortable.  Respiratory system: Clear to auscultation. Respiratory effort normal. Cardiovascular system: S1 & S2 heard, Tachycardic. No JVD,  rubs, gallops or clicks. No murmurs. Gastrointestinal system: Abdomen is nondistended, soft and nontender. No organomegaly or masses felt. Normal bowel sounds heard. Central nervous system: Alert  and oriented. No focal neurological deficits. Extremities: No clubbing,  or cyanosis. No edema. Skin: No rashes, lesions or ulcers.Face remains flushed. Psychiatry: Judgement and insight appear normal. Mood & affect appropriate.   Data Reviewed:   I have personally reviewed following labs and imaging studies:  Labs: Basic Metabolic Panel:  Recent Labs Lab 06/28/16 1115 06/29/16 0454 06/30/16 0534  NA 135 138 135  K 3.9 3.9 3.5  CL 103 108 108  CO2 19* 22 21*  GLUCOSE 131* 106* 116*  BUN 23* 18 17  CREATININE 1.27* 1.14 1.05  CALCIUM 9.4 7.9* 8.1*  MG  --  1.4*  --     GFR Estimated Creatinine Clearance: 82.7 mL/min (by C-G formula based on SCr of 1.05 mg/dL). Liver Function Tests:  Recent Labs Lab 06/28/16 1115  AST 23  ALT 21  ALKPHOS 65  BILITOT 1.6*  PROT 7.9  ALBUMIN 4.8   No results for input(s): LIPASE, AMYLASE in the last 168 hours. No results for input(s): AMMONIA in the last 168 hours. Coagulation profile  Recent Labs Lab 06/28/16 1115  INR 1.04    CBC:  Recent Labs Lab 06/28/16 1115 06/29/16 0454 06/30/16 0534  WBC 15.3* 18.8* 16.1*  NEUTROABS 13.3*  --   --   HGB 14.5 12.6* 12.4*  HCT 41.4 35.6* 35.4*  MCV 82.5 83.4 82.7  PLT 151 132* 122*   Sepsis Labs:  Recent Labs Lab 06/28/16 1115 06/28/16 1124 06/28/16 1644 06/28/16 1847 06/29/16 0454 06/30/16 0534  PROCALCITON 0.43  --   --   --   --   --   WBC 15.3*  --   --   --  18.8* 16.1*  LATICACIDVEN  --  1.96* 0.9 1.5  --   --     Microbiology Recent Results (from the past 240 hour(s))  Blood Culture (routine x 2)     Status: None (Preliminary result)   Collection Time: 06/28/16 11:15 AM  Result Value Ref Range Status   Specimen Description BLOOD RIGHT ANTECUBITAL  Final   Special Requests NONE  Final   Culture   Final    NO GROWTH < 24 HOURS Performed at [facility]    Report Status PENDING  Incomplete  Blood Culture (routine x 2)     Status: None (Preliminary result)   Collection Time: 06/28/16 11:20 AM  Result Value Ref Range Status   Specimen Description BLOOD LEFT ANTECUBITAL  Final   Special Requests NONE  Final   Culture   Final    NO GROWTH < 24 HOURS Performed at [facility]    Report Status PENDING  Incomplete  Urine culture     Status: Abnormal   Collection Time: 06/28/16  1:10 PM  Result Value Ref Range Status   Specimen Description URINE, CATHETERIZED  Final   Special Requests NONE  Final   Culture >=100,000 COLONIES/mL ESCHERICHIA COLI (A)  Final   Report Status 06/30/2016 FINAL  Final   Organism ID,  Bacteria ESCHERICHIA COLI (A)  Final      Susceptibility   Escherichia coli - MIC*    AMPICILLIN <=2 SENSITIVE Sensitive     CEFAZOLIN <=4 SENSITIVE Sensitive     CEFTRIAXONE <=1 SENSITIVE Sensitive     CIPROFLOXACIN <=0.25 SENSITIVE Sensitive     GENTAMICIN <=1 SENSITIVE Sensitive     IMIPENEM <=0.25 SENSITIVE Sensitive     NITROFURANTOIN <=16 SENSITIVE Sensitive     TRIMETH/SULFA <=20 SENSITIVE Sensitive  AMPICILLIN/SULBACTAM <=2 SENSITIVE Sensitive     PIP/TAZO <=4 SENSITIVE Sensitive     Extended ESBL NEGATIVE Sensitive     * >=100,000 COLONIES/mL ESCHERICHIA COLI    Radiology: Dg Chest 2 View  Result Date: 06/28/2016 CLINICAL DATA:  Per [facility]- Pt resides at home. Pt presents with altered mental status- not normal. Pt with UTI symptoms started yesterday. HX of over active bladder, Frequency, hematuria dysuria This am. Fever 101.1 tympanic for EMS. EXAM: CHEST  2 VIEW COMPARISON:  None. FINDINGS: Cardiac silhouette is mildly enlarged. No mediastinal or hilar masses.  No evidence of adenopathy. Lung volumes are low. Allowing for this, lungs are clear. No pleural effusion or pneumothorax. Skeletal structures are unremarkable. IMPRESSION: No active cardiopulmonary disease. Electronically Signed   By: Lajean Manes M.D.   On: 06/28/2016 12:18   US Renal  Result Date: 06/28/2016 CLINICAL DATA:  Acute renal failure EXAM: RENAL / URINARY TRACT ULTRASOUND COMPLETE COMPARISON:  None. FINDINGS: Right Kidney: Length: 10.3 cm. Echogenicity within normal limits. No mass or hydronephrosis visualized. Left Kidney: Length: 12.4 cm. Echogenicity within normal limits. No mass or hydronephrosis visualized. Bladder: Appears normal for degree of bladder distention. IMPRESSION: No acute abnormality noted. Electronically Signed   By: Inez Catalina M.D.   On: 06/28/2016 17:55    Medications:   . cefTRIAXone (ROCEPHIN)  IV  1 g Intravenous Q24H  . citalopram  20 mg Oral Daily  . docusate sodium  200 mg  Oral Daily  . enoxaparin (LOVENOX) injection  40 mg Subcutaneous Q24H  . hydrocortisone cream   Topical TID  . labetalol  200 mg Oral BID  . minoxidil  2.5 mg Oral BID  . pantoprazole  40 mg Oral Daily  . simvastatin  40 mg Oral Daily  . sodium chloride flush  3 mL Intravenous Q12H  . tamsulosin  0.4 mg Oral Daily   Continuous Infusions:    Time spent: 35 minutes.  The patient is medically complex with multiple co-morbidities and is at high risk for clinical deterioration and requires high complexity decision making.    LOS: 2 days   Dannie Hattabaugh  [facility] Pager [phone]. If unable to reach me by pager, please call my cell phone at [phone].  *Please refer to [email or web address], password [email or web address] to get updated schedule on who will round on this patient, as hospitalists switch teams weekly. If 7PM-7AM, please contact night-coverage at [email or web address], password [email or web address] for any overnight needs.  06/30/2016, 10:52 AM

## 2016-06-30 NOTE — Plan of Care (Signed)
Problem: Skin Integrity: Goal: Risk for impaired skin integrity will decrease Continue to reposition every 2 hours and to encourage PO intake.  Continue to monitor skin integrity.    Problem: Activity: Goal: Risk for activity intolerance will decrease PT eval ordered.    Problem: Nutrition: Goal: Adequate nutrition will be maintained Continue.  Pt with improving appetite,  But still not meeting nutritional requirements.  Continue to encourage PO intake.

## 2016-06-30 NOTE — Progress Notes (Signed)
On call was notified overnight via text page that patient continues with low grade fevers and that IV fluid order had discontinued.  No new orders received will continue to monitor patient.

## 2016-07-01 ENCOUNTER — Encounter (HOSPITAL_COMMUNITY): Payer: Self-pay

## 2016-07-01 DIAGNOSIS — N179 Acute kidney failure, unspecified: Secondary | ICD-10-CM | POA: Diagnosis present

## 2016-07-01 LAB — CBC
HCT: 34 % — ABNORMAL LOW (ref 39.0–52.0)
Hemoglobin: 11.9 g/dL — ABNORMAL LOW (ref 13.0–17.0)
MCH: 28.7 pg (ref 26.0–34.0)
MCHC: 35 g/dL (ref 30.0–36.0)
MCV: 81.9 fL (ref 78.0–100.0)
PLATELETS: 147 10*3/uL — AB (ref 150–400)
RBC: 4.15 MIL/uL — AB (ref 4.22–5.81)
RDW: 14.7 % (ref 11.5–15.5)
WBC: 10.7 10*3/uL — ABNORMAL HIGH (ref 4.0–10.5)

## 2016-07-01 MED ORDER — MIRABEGRON ER 25 MG PO TB24
25.0000 mg | ORAL_TABLET | Freq: Every day | ORAL | Status: DC
Start: 2016-07-01 — End: 2016-07-01

## 2016-07-01 MED ORDER — AMPICILLIN 500 MG PO CAPS
500.0000 mg | ORAL_CAPSULE | Freq: Four times a day (QID) | ORAL | Status: DC
  Administered 2016-07-01 – 2016-07-03 (×7): 500 mg via ORAL
  Filled 2016-07-01 (×8): qty 1

## 2016-07-01 MED ORDER — LISINOPRIL 20 MG PO TABS
80.0000 mg | ORAL_TABLET | Freq: Every day | ORAL | Status: DC
  Administered 2016-07-01 – 2016-07-03 (×3): 80 mg via ORAL
  Filled 2016-07-01 (×3): qty 4

## 2016-07-01 NOTE — Progress Notes (Signed)
Progress Note    Bryan Bartlett  [id number] DOB: 1956-04-05  DOA: 06/28/2016 PCP: No primary care provider on file.    Brief Narrative:   Bryan Bartlett is an 60 y.o. male with a PMH of CVA/left hemiparesis and wheelchair-bound status, prostate cancer which is being managed conservatively with monitoring, poorly controlled high blood pressure, anxiety, dyslipidemia and GERD who presented to the hospital 06/28/16 for evaluation of dysuria, fever and chills. Patient was found to have sepsis secondary to UTI.  Assessment/Plan:   Principal Problem:   Sepsis ([place]) secondary to UTI complicated by urinary retention/Acute kidney injury in the setting of prostate cancer Sepsis order set utilized. The patient received appropriate IV fluids with bolus in the ED. Serum lactate initially elevated, cleared with IV fluids. Urine culture/blood culture sent. IV Rocephin empirically started. Flomax was also started to address his urinary retention (Myrbetriq d/c'd, which can cause urinary retention). He was noted to have a post void bladder residual of 100 mL. Renal ultrasound negative for hydronephrosis. He will need to follow-up with urologist post discharge. Urine cultures positive for Escherichia coli, so Rocephin narrowed to ampicillin.    Tick bite Doxycycline was empirically added 06/30/16 after the patient revealed that he had a tick bite. Follow-up [facility] spotted fever titers and Lyme disease PCR studies.  Active Problems:   Poorly controlled essential hypertension The patient's blood pressure was soft on admission, consistent with sepsis. Blood pressure has improved with fluid volume resuscitation. Resume lisinopril now that renal function back to baseline. Continue labetalol and minoxidil. Lasix remains on hold.    History of CVA with left-sided hemiparesis Continue secondary prevention.    Dyslipidemia Continue statin.    Depression and anxiety Continue Celexa and  Xanax.    GERD Continue PPI.  Family Communication/Anticipated D/C date and plan/Code Status   DVT prophylaxis: Lovenox ordered. Code Status: Full Code.  Family Communication: No family at bedside. Disposition Plan: Home when RMSF results back, afbrile x 24 hours, possibly 07/02/16.   Medical Consultants:    None   Procedures:    None  Anti-Infectives:   Rocephin 06/28/16--->06/30/16 Doxycycline 06/30/16---> Ampicillin 06/30/16--->  Subjective:    Bryan Bartlett ambulated with physical therapy today, remains fairly weak. No complaints of headache. Reports that he feels better overall. No dyspnea. Last recorded fever was at 10:46 PM last night.  Objective:    Vitals:   06/30/16 2246 07/01/16 0353 07/01/16 0458 07/01/16 0744  BP:   (!) 158/89   Pulse:   86   Resp:   (!) 22   Temp: (!) 101 F (38.3 C) 99.6 F (37.6 C) 99.7 F (37.6 C) 98.6 F (37 C)  TempSrc: Rectal Oral Oral Oral  SpO2:   96%   Weight:   89.4 kg (197 lb 1.6 oz)   Height:        Intake/Output Summary (Last 24 hours) at 07/01/16 0912 Last data filed at 06/30/16 1929  Gross per 24 hour  Intake              470 ml  Output              100 ml  Net              370 ml   Filed Weights   06/29/16 0432 06/30/16 0506 07/01/16 0458  Weight: 89.4 kg (197 lb) 90.5 kg (199 lb 9.6 oz) 89.4 kg (197 lb 1.6 oz)  Exam: General exam: Appears calm and comfortable.  Respiratory system: Clear to auscultation. Respiratory effort normal. Cardiovascular system: S1 & S2 heard, Tachycardic. No JVD,  rubs, gallops or clicks. No murmurs. Gastrointestinal system: Abdomen is nondistended, soft and nontender. No organomegaly or masses felt. Normal bowel sounds heard. Central nervous system: Alert and oriented. No focal neurological deficits. Extremities: No clubbing,  or cyanosis. No edema. Skin: No rashes, lesions or ulcers.Face remains flushed. Psychiatry: Judgement and insight appear normal. Mood & affect  appropriate.   Data Reviewed:   I have personally reviewed following labs and imaging studies:  Labs: Basic Metabolic Panel:  Recent Labs Lab 06/28/16 1115 06/29/16 0454 06/30/16 0534  NA 135 138 135  K 3.9 3.9 3.5  CL 103 108 108  CO2 19* 22 21*  GLUCOSE 131* 106* 116*  BUN 23* 18 17  CREATININE 1.27* 1.14 1.05  CALCIUM 9.4 7.9* 8.1*  MG  --  1.4*  --    GFR Estimated Creatinine Clearance: 82.3 mL/min (by C-G formula based on SCr of 1.05 mg/dL). Liver Function Tests:  Recent Labs Lab 06/28/16 1115  AST 23  ALT 21  ALKPHOS 65  BILITOT 1.6*  PROT 7.9  ALBUMIN 4.8   No results for input(s): LIPASE, AMYLASE in the last 168 hours. No results for input(s): AMMONIA in the last 168 hours. Coagulation profile  Recent Labs Lab 06/28/16 1115  INR 1.04    CBC:  Recent Labs Lab 06/28/16 1115 06/29/16 0454 06/30/16 0534 07/01/16 0501  WBC 15.3* 18.8* 16.1* 10.7*  NEUTROABS 13.3*  --   --   --   HGB 14.5 12.6* 12.4* 11.9*  HCT 41.4 35.6* 35.4* 34.0*  MCV 82.5 83.4 82.7 81.9  PLT 151 132* 122* 147*   Sepsis Labs:  Recent Labs Lab 06/28/16 1115 06/28/16 1124 06/28/16 1644 06/28/16 1847 06/29/16 0454 06/30/16 0534 07/01/16 0501  PROCALCITON 0.43  --   --   --   --   --   --   WBC 15.3*  --   --   --  18.8* 16.1* 10.7*  LATICACIDVEN  --  1.96* 0.9 1.5  --   --   --     Microbiology Recent Results (from the past 240 hour(s))  Blood Culture (routine x 2)     Status: None (Preliminary result)   Collection Time: 06/28/16 11:15 AM  Result Value Ref Range Status   Specimen Description BLOOD RIGHT ANTECUBITAL  Final   Special Requests NONE  Final   Culture   Final    NO GROWTH 2 DAYS Performed at [facility]    Report Status PENDING  Incomplete  Blood Culture (routine x 2)     Status: None (Preliminary result)   Collection Time: 06/28/16 11:20 AM  Result Value Ref Range Status   Specimen Description BLOOD LEFT ANTECUBITAL  Final   Special  Requests NONE  Final   Culture   Final    NO GROWTH 2 DAYS Performed at [facility]    Report Status PENDING  Incomplete  Urine culture     Status: Abnormal   Collection Time: 06/28/16  1:10 PM  Result Value Ref Range Status   Specimen Description URINE, CATHETERIZED  Final   Special Requests NONE  Final   Culture >=100,000 COLONIES/mL ESCHERICHIA COLI (A)  Final   Report Status 06/30/2016 FINAL  Final   Organism ID, Bacteria ESCHERICHIA COLI (A)  Final      Susceptibility  Escherichia coli - MIC*    AMPICILLIN <=2 SENSITIVE Sensitive     CEFAZOLIN <=4 SENSITIVE Sensitive     CEFTRIAXONE <=1 SENSITIVE Sensitive     CIPROFLOXACIN <=0.25 SENSITIVE Sensitive     GENTAMICIN <=1 SENSITIVE Sensitive     IMIPENEM <=0.25 SENSITIVE Sensitive     NITROFURANTOIN <=16 SENSITIVE Sensitive     TRIMETH/SULFA <=20 SENSITIVE Sensitive     AMPICILLIN/SULBACTAM <=2 SENSITIVE Sensitive     PIP/TAZO <=4 SENSITIVE Sensitive     Extended ESBL NEGATIVE Sensitive     * >=100,000 COLONIES/mL ESCHERICHIA COLI    Radiology: No results found.  Medications:   . cefTRIAXone (ROCEPHIN)  IV  1 g Intravenous Q24H  . citalopram  20 mg Oral Daily  . docusate sodium  200 mg Oral Daily  . doxycycline  100 mg Oral Q12H  . enoxaparin (LOVENOX) injection  40 mg Subcutaneous Q24H  . hydrocortisone cream   Topical TID  . labetalol  200 mg Oral BID  . minoxidil  2.5 mg Oral BID  . pantoprazole  40 mg Oral Daily  . simvastatin  40 mg Oral Daily  . sodium chloride flush  3 mL Intravenous Q12H  . tamsulosin  0.4 mg Oral Daily   Continuous Infusions:    Time spent: 25 minutes.    LOS: 3 days   [facility] Pager [phone]. If unable to reach me by pager, please call my cell phone at [phone].  *Please refer to [email or web address], password [email or web address] to get updated schedule on who will round on this patient, as hospitalists switch teams weekly. If 7PM-7AM, please contact night-coverage at  [email or web address], password [email or web address] for any overnight needs.  07/01/2016, 9:12 AM

## 2016-07-01 NOTE — Evaluation (Addendum)
Physical Therapy Evaluation Patient Details Name: Bryan Bartlett MRN: [id number] DOB: May 03, 1956 Today's Date: 07/01/2016   History of Present Illness  ERIQUE Bartlett is an 60 y.o. male with a PMH of CVA/left hemiparesis and wheelchair-bound status, prostate cancer which is being managed conservatively with monitoring, poorly controlled high blood pressure, anxiety, dyslipidemia and GERD who presented to the hospital 06/28/16 for evaluation of dysuria, fever and chills. Patient was found to have sepsis secondary to UTI.  Clinical Impression  Pt admitted with above diagnosis. Pt currently with functional limitations due to the deficits listed below (see PT Problem List).  Pt will benefit from skilled PT to increase their independence and safety with mobility to allow discharge to the venue listed below.   8/17:  Per discussion with RN pt sister does not feel like she can manage pt's care at home; He is of course at risk for falls and would need Supervision/assist when OOB; Pt would likely benefit from SNF placement    Follow Up Recommendations SNF    Equipment Recommendations  None recommended by PT    Recommendations for Other Services       Precautions / Restrictions Precautions Precautions: Fall      Mobility  Bed Mobility Overal bed mobility: Needs Assistance Bed Mobility: Supine to Sit     Supine to sit: Min assist     General bed mobility comments: facilitation LLE movement, incr time  Transfers Overall transfer level: Needs assistance Equipment used: Rolling walker (2 wheeled) Transfers: Sit to/from Stand Sit to Stand: Min guard         General transfer comment: incr time and cues for safety  Ambulation/Gait Ambulation/Gait assistance: Min guard;Min assist Ambulation Distance (Feet): 11 Feet Assistive device: Rolling walker (2 wheeled) Gait Pattern/deviations: Step-to pattern;Decreased weight shift to left     General Gait Details: assist to advance  RW and to maneuver, cues for RW distance from self  Stairs            Wheelchair Mobility    Modified Rankin (Stroke Patients Only)       Balance Overall balance assessment: Needs assistance           Standing balance-Leahy Scale: Poor Standing balance comment: reliant on UEs                             Pertinent Vitals/Pain Pain Assessment: No/denies pain    Home Living Family/patient expects to be discharged to:: Private residence Living Arrangements: Other relatives (sister ) Available Help at Discharge: Family;Personal care attendant Type of Home: House       Home Layout: Two level Home Equipment: Environmental consultant - 2 wheels;Grab bars - tub/shower      Prior Function                 Hand Dominance        Extremity/Trunk Assessment   Upper Extremity Assessment: Overall WFL for tasks assessed           Lower Extremity Assessment: LLE deficits/detail   LLE Deficits / Details: incr flexor tone with activity; df contracture; grossly 3+/5 and is able to move hip and knee through full active ROM     Communication   Communication: No difficulties  Cognition Arousal/Alertness: Awake/alert Behavior During Therapy: WFL for tasks assessed/performed Overall Cognitive Status: Within Functional Limits for tasks assessed Area of Impairment: Attention   Current Attention Level: Sustained   Following Commands:  Follows one step commands with increased time     Problem Solving: Slow processing;Requires verbal cues General Comments: pt is unable to converse while walking, has to stop to answer basic questions    General Comments      Exercises        Assessment/Plan    PT Assessment Patient needs continued PT services  PT Diagnosis Difficulty walking   PT Problem List Decreased strength;Decreased range of motion;Decreased mobility;Decreased activity tolerance;Decreased balance  PT Treatment Interventions DME instruction;Gait  training;Therapeutic activities;Therapeutic exercise;Functional mobility training   PT Goals (Current goals can be found in the Care Plan section) Acute Rehab PT Goals Patient Stated Goal: back to normal PT Goal Formulation: With patient Time For Goal Achievement: 07/15/16 Potential to Achieve Goals: Good    Frequency Min 3X/week   Barriers to discharge        Co-evaluation               End of Session Equipment Utilized During Treatment: Gait belt Activity Tolerance: Patient tolerated treatment well Patient left: in chair;with chair alarm set (multi-disciplinary team with pt)           Time: [id number] PT Time Calculation (min) (ACUTE ONLY): 16 min   Charges:   PT Evaluation $PT Eval Low Complexity: 1 Procedure     PT G Codes:        [facility] Jul 17, 2016, 12:53 PM

## 2016-07-01 NOTE — Progress Notes (Signed)
Pt states he lives with his sister with caregivers coming into the home.  There are no farther needs at present time.

## 2016-07-02 DIAGNOSIS — G9340 Encephalopathy, unspecified: Secondary | ICD-10-CM | POA: Diagnosis present

## 2016-07-02 DIAGNOSIS — A4151 Sepsis due to Escherichia coli [E. coli]: Secondary | ICD-10-CM

## 2016-07-02 LAB — CBC
HCT: 35.9 % — ABNORMAL LOW (ref 39.0–52.0)
Hemoglobin: 12.5 g/dL — ABNORMAL LOW (ref 13.0–17.0)
MCH: 28.2 pg (ref 26.0–34.0)
MCHC: 34.8 g/dL (ref 30.0–36.0)
MCV: 81 fL (ref 78.0–100.0)
PLATELETS: 163 10*3/uL (ref 150–400)
RBC: 4.43 MIL/uL (ref 4.22–5.81)
RDW: 14.8 % (ref 11.5–15.5)
WBC: 8.3 10*3/uL (ref 4.0–10.5)

## 2016-07-02 LAB — BASIC METABOLIC PANEL
ANION GAP: 8 (ref 5–15)
BUN: 18 mg/dL (ref 6–20)
CHLORIDE: 105 mmol/L (ref 101–111)
CO2: 23 mmol/L (ref 22–32)
Calcium: 8.5 mg/dL — ABNORMAL LOW (ref 8.9–10.3)
Creatinine, Ser: 0.99 mg/dL (ref 0.61–1.24)
GFR calc Af Amer: >60 mL/min (ref >60)
GLUCOSE: 132 mg/dL — AB (ref 65–99)
POTASSIUM: 3.6 mmol/L (ref 3.5–5.1)
SODIUM: 136 mmol/L (ref 135–145)

## 2016-07-02 LAB — RMSF, IGG, IFA: RMSF, IGG, IFA: <1:64 {titer}

## 2016-07-02 LAB — ROCKY MTN SPOTTED FVR ABS PNL(IGG+IGM)
RMSF IGG: POSITIVE — AB
RMSF IGM: 0.24 {index} (ref 0.00–0.89)

## 2016-07-02 LAB — LYME DISEASE DNA BY PCR(BORRELIA BURG): Lyme Disease(B.burgdorferi)PCR: NEGATIVE

## 2016-07-02 LAB — TSH: TSH: 3.325 u[IU]/mL (ref 0.350–4.500)

## 2016-07-02 MED ORDER — SACCHAROMYCES BOULARDII 250 MG PO CAPS
250.0000 mg | ORAL_CAPSULE | Freq: Two times a day (BID) | ORAL | Status: DC
  Administered 2016-07-02 – 2016-07-03 (×3): 250 mg via ORAL
  Filled 2016-07-02 (×3): qty 1

## 2016-07-02 NOTE — Progress Notes (Addendum)
[facility] Progress Note  Patient: Bryan Bartlett [id number]   PCP: No primary care provider on file. DOB: August 01, 1956   DOA: 06/28/2016   DOS: 07/02/2016   Date of Service: the patient was seen and examined on 07/02/2016  Subjective: The patient remains significantly confused. Patient mentions about having diarrhea throughout the night while he did not have any bowel movement overnight. Also talks tangentially about various topics at the same time. Denies any other acute complaint. Nutrition: Tolerating oral diet  Brief hospital course: RIAD SLEMMER is an 60 y.o. male with a PMH of CVA/left hemiparesis and wheelchair-bound status, prostate cancer which is being managed conservatively with monitoring, poorly controlled high blood pressure, anxiety, dyslipidemia and GERD who presented to the hospital 06/28/16 for evaluation of dysuria, fever and chills. Patient was found to have sepsis secondary to UTI.  Currently further plan is continue ampicillin.  Assessment and Plan: 1.  Sepsis ([place]) secondary to UTI complicated by urinary retention/Acute kidney injury in the setting of prostate cancer Sepsis order set utilized. The patient received appropriate IV fluids with bolus in the ED. Serum lactate initially elevated, cleared with IV fluids. Urine culture/blood culture sent. IV Rocephin empirically started. Flomax was also started to address his urinary retention (Myrbetriq d/c'd, which can cause urinary retention). He was noted to have a post void bladder residual of 100 mL. Renal ultrasound negative for hydronephrosis. He will need to follow-up with urologist post discharge. Urine cultures positive for Escherichia coli, so Rocephin narrowed to ampicillin.    Tick bite Doxycycline was empirically added 06/30/16 after the patient revealed that he had a tick bite. [facility] spotted fever IgM negative, IgG elevated but not significant and negative Lyme disease PCR studies.  Discontinue doxycycline  Active Problems:    acute encephalopathy. The patient is significantly confused and not at his baseline as per family. This is likely in the setting of sepsis and delirium from UTI. We'll continue to closely monitor and check vitamins as well as TSH. No asterixis. No focal deficit on examination.  Poorly controlled essential hypertension The patient's blood pressure was soft on admission, consistent with sepsis. Blood pressure has improved with fluid volume resuscitation. Resume lisinopril now that renal function back to baseline. Continue labetalol and minoxidil. Lasix remains on hold.    History of CVA with left-sided hemiparesis Continue secondary prevention.    Dyslipidemia Continue statin.    Depression and anxiety Continue Celexa and Xanax.    GERD Continue PPI.    Diarrhea Discontinue stool softner and add probiotics.   Pain management: When necessary Tylenol Activity: Home health per physical therapy Bowel regimen: last BM 06/30/2016 Diet: Cardiac diet DVT Prophylaxis: subcutaneous Heparin  Advance goals of care discussion: Full code  Family Communication: family was present at bedside, at the time of interview. The pt provided permission to discuss medical plan with the family. Opportunity was given to ask question and all questions were answered satisfactorily.   Disposition:  Discharge to home. Expected discharge date: 07/03/2016, home health  Consultants: None Procedures: None  Antibiotics: Anti-infectives    Start     Dose/Rate Route Frequency Ordered Stop   07/02/16 0000  ampicillin (PRINCIPEN) capsule 500 mg     500 mg Oral Every 6 hours 07/01/16 1522     06/30/16 1200  doxycycline (VIBRA-TABS) tablet 100 mg  Status:  Discontinued     100 mg Oral Every 12 hours 06/30/16 1145 07/02/16 1647   06/29/16 1200  cefTRIAXone (ROCEPHIN)  1 g in dextrose 5 % 50 mL IVPB  Status:  Discontinued     1 g 100 mL/hr over 30 Minutes  Intravenous Every 24 hours 06/28/16 1209 07/01/16 1522   06/28/16 1200  cefTRIAXone (ROCEPHIN) 2 g in dextrose 5 % 50 mL IVPB     2 g 100 mL/hr over 30 Minutes Intravenous  Once 06/28/16 1159 06/28/16 1446        Intake/Output Summary (Last 24 hours) at 07/02/16 1903 Last data filed at 07/02/16 1600  Gross per 24 hour  Intake              720 ml  Output             1150 ml  Net             -430 ml   Filed Weights   06/29/16 0432 06/30/16 0506 07/01/16 0458  Weight: 89.4 kg (197 lb) 90.5 kg (199 lb 9.6 oz) 89.4 kg (197 lb 1.6 oz)    Objective: Physical Exam: Vitals:   07/02/16 0434 07/02/16 0627 07/02/16 0954 07/02/16 1345  BP: (!) 165/97 (!) 149/88 (!) 162/94 (!) 151/75  Pulse: 80  98 89  Resp: 18   20  Temp: 98.8 F (37.1 C)   98.5 F (36.9 C)  TempSrc: Oral   Oral  SpO2: 98%   98%  Weight:      Height:        General: Alert, Awake and Oriented to only Place and Person. Appear in moderate distress Eyes: PERRL, Conjunctiva normal ENT: Oral Mucosa clear moist. Neck: no JVD, no Abnormal Mass Or lumps Cardiovascular: S1 and S2 Present, no Murmur, Respiratory: Bilateral Air entry equal and Decreased, Clear to Auscultation, no Crackles, no wheezes Abdomen: Bowel Sound present, Soft and no tenderness Skin: no redness, no Rash  Extremities: no Pedal edema, no calf tenderness Neurologic: Grossly no focal neuro deficit. Bilaterally Equal motor strength  Data Reviewed: CBC:  Recent Labs Lab 06/28/16 1115 06/29/16 0454 06/30/16 0534 07/01/16 0501 07/02/16 0518  WBC 15.3* 18.8* 16.1* 10.7* 8.3  NEUTROABS 13.3*  --   --   --   --   HGB 14.5 12.6* 12.4* 11.9* 12.5*  HCT 41.4 35.6* 35.4* 34.0* 35.9*  MCV 82.5 83.4 82.7 81.9 81.0  PLT 151 132* 122* 147* [id number]   Basic Metabolic Panel:  Recent Labs Lab 06/28/16 1115 06/29/16 0454 06/30/16 0534 07/02/16 0518  NA 135 138 135 136  K 3.9 3.9 3.5 3.6  CL 103 108 108 105  CO2 19* 22 21* 23  GLUCOSE 131* 106* 116*  132*  BUN 23* 18 17 18   CREATININE 1.27* 1.14 1.05 0.99  CALCIUM 9.4 7.9* 8.1* 8.5*  MG  --  1.4*  --   --     Liver Function Tests:  Recent Labs Lab 06/28/16 1115  AST 23  ALT 21  ALKPHOS 65  BILITOT 1.6*  PROT 7.9  ALBUMIN 4.8   No results for input(s): LIPASE, AMYLASE in the last 168 hours. No results for input(s): AMMONIA in the last 168 hours. Coagulation Profile:  Recent Labs Lab 06/28/16 1115  INR 1.04   Cardiac Enzymes: No results for input(s): CKTOTAL, CKMB, CKMBINDEX, TROPONINI in the last 168 hours. BNP (last 3 results) No results for input(s): PROBNP in the last 8760 hours.  CBG: No results for input(s): GLUCAP in the last 168 hours.  Studies: No results found.   Scheduled Meds: . ampicillin  500 mg  Oral Q6H  . citalopram  20 mg Oral Daily  . enoxaparin (LOVENOX) injection  40 mg Subcutaneous Q24H  . hydrocortisone cream   Topical TID  . labetalol  200 mg Oral BID  . lisinopril  80 mg Oral Daily  . minoxidil  2.5 mg Oral BID  . pantoprazole  40 mg Oral Daily  . saccharomyces boulardii  250 mg Oral BID  . simvastatin  40 mg Oral Daily  . sodium chloride flush  3 mL Intravenous Q12H  . tamsulosin  0.4 mg Oral Daily   Continuous Infusions:  PRN Meds: acetaminophen **OR** acetaminophen, albuterol, ALPRAZolam, HYDROcodone-acetaminophen, ondansetron **OR** ondansetron (ZOFRAN) IV, polyvinyl alcohol  Time spent: 30 minutes  Author: Berle Mull, MD [facility] Pager: [phone] 07/02/2016 7:03 PM  If 7PM-7AM, please contact night-coverage at [email or web address], password [facility]

## 2016-07-02 NOTE — Care Management Important Message (Signed)
Important Message  Patient Details  Name: Bryan Bartlett MRN: [id number] Date of Birth: [birth year]   Medicare Important Message Given:  Yes    Camillo Flaming 07/02/2016, 8:52 AMImportant Message  Patient Details  Name: Bryan Bartlett MRN: [id number] Date of Birth: 11-20-1955   Medicare Important Message Given:  Yes    Camillo Flaming 07/02/2016, 8:52 AM

## 2016-07-03 DIAGNOSIS — N179 Acute kidney failure, unspecified: Secondary | ICD-10-CM

## 2016-07-03 DIAGNOSIS — T148 Other injury of unspecified body region: Secondary | ICD-10-CM

## 2016-07-03 DIAGNOSIS — K219 Gastro-esophageal reflux disease without esophagitis: Secondary | ICD-10-CM

## 2016-07-03 DIAGNOSIS — W57XXXA Bitten or stung by nonvenomous insect and other nonvenomous arthropods, initial encounter: Secondary | ICD-10-CM

## 2016-07-03 DIAGNOSIS — N390 Urinary tract infection, site not specified: Secondary | ICD-10-CM

## 2016-07-03 LAB — CULTURE, BLOOD (ROUTINE X 2)
CULTURE: NO GROWTH
Culture: NO GROWTH

## 2016-07-03 LAB — FOLATE: FOLATE: 31.7 ng/mL (ref >5.9)

## 2016-07-03 LAB — VITAMIN B12: VITAMIN B 12: 523 pg/mL (ref 180–914)

## 2016-07-03 MED ORDER — TAMSULOSIN HCL 0.4 MG PO CAPS: 0.4000 mg | ORAL_CAPSULE | Freq: Every day | ORAL | 0 refills | Status: DC

## 2016-07-03 MED ORDER — FUROSEMIDE 20 MG PO TABS: 20.0000 mg | ORAL_TABLET | Freq: Every day | ORAL | 0 refills | Status: AC | PRN

## 2016-07-03 MED ORDER — DOCUSATE SODIUM 100 MG PO CAPS: 100.0000 mg | ORAL_CAPSULE | Freq: Two times a day (BID) | ORAL | 0 refills | Status: DC | PRN

## 2016-07-03 MED ORDER — ONDANSETRON HCL 4 MG PO TABS: 4.0000 mg | ORAL_TABLET | Freq: Four times a day (QID) | ORAL | 0 refills | Status: DC | PRN

## 2016-07-03 MED ORDER — SACCHAROMYCES BOULARDII 250 MG PO CAPS: 250.0000 mg | ORAL_CAPSULE | Freq: Two times a day (BID) | ORAL | 0 refills | Status: DC

## 2016-07-03 MED ORDER — AMPICILLIN 500 MG PO CAPS: 500.0000 mg | ORAL_CAPSULE | Freq: Four times a day (QID) | ORAL | 0 refills | Status: AC

## 2016-07-03 NOTE — NC FL2 (Signed)
[place] LEVEL OF CARE SCREENING TOOL     IDENTIFICATION  Patient Name: Bryan Bartlett Birthdate: 08-24-1956 Sex: male Admission Date (Current Location): 06/28/2016  [facility] and [place] Number:  Herbalist and Address:  [facility],  [place] [address], [place]      Provider Number: [id number]  Attending Physician Name and Address:  Lavina Hamman, MD  Relative Name and Phone Number:       Current Level of Care: Hospital Recommended Level of Care: [place] Prior Approval Number:    Date Approved/Denied:   PASRR Number: [id number]  Discharge Plan: Home    Current Diagnoses: Patient Active Problem List   Diagnosis Date Noted  . Acute encephalopathy 07/02/2016  . AKI (acute kidney injury) ([place]) 07/01/2016  . Tick bite 06/30/2016  . Dyslipidemia 06/29/2016  . Anxiety and depression 06/29/2016  . GERD (gastroesophageal reflux disease) 06/29/2016  . Sepsis ([place]) 06/28/2016  . UTI (lower urinary tract infection) 06/28/2016  . Prostate cancer ([place]) 06/28/2016  . Stroke ([place])   . Hypertension     Orientation RESPIRATION BLADDER Height & Weight     Self, Time, Place  Normal Incontinent, External catheter Weight: 196 lb 4.8 oz (89 kg) Height:  5\' 8"  (172.7 cm)  BEHAVIORAL SYMPTOMS/MOOD NEUROLOGICAL BOWEL NUTRITION STATUS      Continent Diet (Heart Healthy)  AMBULATORY STATUS COMMUNICATION OF NEEDS Skin   Extensive Assist Verbally Normal                       Personal Care Assistance Level of Assistance  Bathing, Dressing Bathing Assistance: Limited assistance   Dressing Assistance: Limited assistance     Functional Limitations Info             SPECIAL CARE FACTORS FREQUENCY  PT (By licensed PT), OT (By licensed OT)     PT Frequency: 5 OT Frequency: 5            Contractures      Additional Factors Info  Code Status, Allergies Code Status Info: Fullcode Allergies Info:  NKDA           Current Medications (07/03/2016):  This is the current hospital active medication list Current Facility-Administered Medications  Medication Dose Route Frequency Provider Last Rate Last Dose  . acetaminophen (TYLENOL) tablet 650 mg  650 mg Oral Q6H PRN Thurnell Lose, MD   650 mg at 07/01/16 0405   Or  . acetaminophen (TYLENOL) suppository 650 mg  650 mg Rectal Q6H PRN Thurnell Lose, MD      . albuterol (PROVENTIL) (2.5 MG/3ML) 0.083% nebulizer solution 2.5 mg  2.5 mg Nebulization Q4H PRN Thurnell Lose, MD      . ALPRAZolam Duanne Moron) tablet 0.25 mg  0.25 mg Oral Daily PRN Thurnell Lose, MD   0.25 mg at 07/02/16 2357  . ampicillin (PRINCIPEN) capsule 500 mg  500 mg Oral Q6H Venetia Maxon Rama, MD   500 mg at 07/03/16 0558  . citalopram (CELEXA) tablet 20 mg  20 mg Oral Daily Thurnell Lose, MD   20 mg at 07/03/16 1041  . enoxaparin (LOVENOX) injection 40 mg  40 mg Subcutaneous Q24H Venetia Maxon Rama, MD   40 mg at 07/02/16 1711  . HYDROcodone-acetaminophen (NORCO/VICODIN) 5-325 MG per tablet 1 tablet  1 tablet Oral Q4H PRN Thurnell Lose, MD   1 tablet at 06/29/16 0017  . hydrocortisone cream 1 %  Topical TID Venetia Maxon Rama, MD      . labetalol (NORMODYNE) tablet 200 mg  200 mg Oral BID Thurnell Lose, MD   200 mg at 07/03/16 1041  . lisinopril (PRINIVIL,ZESTRIL) tablet 80 mg  80 mg Oral Daily Venetia Maxon Rama, MD   80 mg at 07/03/16 1040  . minoxidil (LONITEN) tablet 2.5 mg  2.5 mg Oral BID Thurnell Lose, MD   2.5 mg at 07/03/16 1041  . ondansetron (ZOFRAN) tablet 4 mg  4 mg Oral Q6H PRN Thurnell Lose, MD       Or  . ondansetron (ZOFRAN) injection 4 mg  4 mg Intravenous Q6H PRN Thurnell Lose, MD      . pantoprazole (PROTONIX) EC tablet 40 mg  40 mg Oral Daily Thurnell Lose, MD   40 mg at 07/03/16 1041  . polyvinyl alcohol (LIQUIFILM TEARS) 1.4 % ophthalmic solution 1 drop  1 drop Both Eyes PRN Christina P Rama, MD      . saccharomyces boulardii  (FLORASTOR) capsule 250 mg  250 mg Oral BID Lavina Hamman, MD   250 mg at 07/03/16 1000  . simvastatin (ZOCOR) tablet 40 mg  40 mg Oral Daily Thurnell Lose, MD   40 mg at 07/03/16 1041  . sodium chloride flush (NS) 0.9 % injection 3 mL  3 mL Intravenous Q12H Thurnell Lose, MD   3 mL at 07/02/16 0956  . tamsulosin (FLOMAX) capsule 0.4 mg  0.4 mg Oral Daily Thurnell Lose, MD   0.4 mg at 07/03/16 1041     Discharge Medications: Please see discharge summary for a list of discharge medications.  Relevant Imaging Results:  Relevant Lab Results:   Additional Information SSN: [id number]  Standley Brooking, LCSW

## 2016-07-03 NOTE — Clinical Social Work Placement (Signed)
Patient is set to discharge to [name] today. Patient & sister, Bryan Bartlett made aware. Discharge packet given to RN, [name]. [facility] called for transport.     Raynaldo Opitz, [facility] Clinical Social Worker cell #: [phone]   CLINICAL SOCIAL WORK PLACEMENT  NOTE  Date:  07/03/2016  Patient Details  Name: Bryan Bartlett MRN: [id number] Date of Birth: [birth year]  Clinical Social Work is seeking post-discharge placement for this patient at the [place] level of care (*CSW will initial, date and re-position this form in  chart as items are completed):  Yes   Patient/family provided with [facility] Work Department's list of facilities offering this level of care within the geographic area requested by the patient (or if unable, by the patient's family).  Yes   Patient/family informed of their freedom to choose among providers that offer the needed level of care, that participate in Medicare, Medicaid or managed care program needed by the patient, have an available bed and are willing to accept the patient.  Yes   Patient/family informed of [facility]'s ownership interest in [facility] and [facility], as well as of the fact that they are under no obligation to receive care at these facilities.  PASRR submitted to EDS on 07/03/16     PASRR number received on 07/03/16     Existing PASRR number confirmed on       FL2 transmitted to all facilities in geographic area requested by pt/family on 07/03/16     FL2 transmitted to all facilities within larger geographic area on       Patient informed that his/her managed care company has contracts with or will negotiate with certain facilities, including the following:        Yes   Patient/family informed of bed offers received.  Patient chooses bed at [facility], [place]     Physician recommends and patient chooses bed at      Patient to be  transferred to [facility] on 07/03/16.  Patient to be transferred to facility by [facility]     Patient family notified on 07/03/16 of transfer.  Name of family member notified:  patient's sister, Bryan Bartlett via phone     PHYSICIAN       Additional Comment:    _______________________________________________ Standley Brooking, LCSW 07/03/2016, 11:36 AM

## 2016-07-03 NOTE — Clinical Social Work Placement (Signed)
CSW received consult for SNF placement. CSW spoke with patient's sister, Bryan Bartlett (home#: [phone]) re: discharge planning - sister states that she would feel more comfortable with him going to a rehab facility to get a little stronger before returning home. CSW sent information out to [facility] and will follow-up with bed offers.     Raynaldo Opitz, [facility] Clinical Social Worker cell #: [phone]    CLINICAL SOCIAL WORK PLACEMENT  NOTE  Date:  07/03/2016  Patient Details  Name: Bryan Bartlett MRN: [id number] Date of Birth: Dec 26, 1955  Clinical Social Work is seeking post-discharge placement for this patient at the [place] level of care (*CSW will initial, date and re-position this form in  chart as items are completed):  Yes   Patient/family provided with [facility] Work Department's list of facilities offering this level of care within the geographic area requested by the patient (or if unable, by the patient's family).  Yes   Patient/family informed of their freedom to choose among providers that offer the needed level of care, that participate in Medicare, Medicaid or managed care program needed by the patient, have an available bed and are willing to accept the patient.  Yes   Patient/family informed of [facility]'s ownership interest in [facility] and [facility], as well as of the fact that they are under no obligation to receive care at these facilities.  PASRR submitted to EDS on 07/03/16     PASRR number received on 07/03/16     Existing PASRR number confirmed on       FL2 transmitted to all facilities in geographic area requested by pt/family on 07/03/16     FL2 transmitted to all facilities within larger geographic area on       Patient informed that his/her managed care company has contracts with or will negotiate with certain facilities, including the following:         Patient/family informed of bed offers received.  Patient chooses bed at       Physician recommends and patient chooses bed at      Patient to be transferred to   on  .  Patient to be transferred to facility by       Patient family notified on   of transfer.  Name of family member notified:        PHYSICIAN       Additional Comment:    _______________________________________________ Standley Brooking, LCSW 07/03/2016, 11:21 AM

## 2016-07-03 NOTE — Discharge Summary (Signed)
[facility] Discharge Summary   Patient: Bryan Bartlett [id number]   PCP: No primary care provider on file. DOB: May 04, 1956   Date of admission: 06/28/2016   Date of discharge:  07/03/2016    Discharge Diagnoses:  Principal Problem:   Sepsis ([place]) Active Problems:   Stroke [facility])   Hypertension   UTI (lower urinary tract infection)   Prostate cancer ([place])   Dyslipidemia   Anxiety and depression   GERD (gastroesophageal reflux disease)   Tick bite   AKI (acute kidney injury) ([place])   Acute encephalopathy  Admitted From: home Disposition:  SNF  Recommendations for Outpatient Follow-up:  1. Please follow up with PCP in 1 week.  2. Establish care with urologist in 2 week  Diet recommendation: cardiac diet  Activity: The patient is advised to gradually reintroduce usual activities.  Discharge Condition: good  Code Status: full code  History of present illness: As per the H and P dictated on admission, "Bryan Bartlett  is a 60 y.o. 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  The ER diagnosed with sepsis due to UTI, patient's review of systems besides above is entirely unremarkable, I was called to admit him for sepsis treatment. "  Hospital Course:  Summary of his active problems in the hospital is as following.  1. Sepsis ([place]) secondary to UTI complicated by urinary retention/Acute kidney injuryin the setting of prostate cancer Sepsis order set utilized. The patient received appropriate IV fluids with bolus in the ED. Serum lactate initially elevated, cleared with IV fluids. Urine culture/blood culture sent. IV Rocephin empirically started.  Urine cultures  positive for pansensitive Escherichia coli, so Rocephin narrowed to ampicillin. Blood cultures negative. Flomax was also started to address his urinary retention (Myrbetriq d/c'd, which can cause urinary retention). He was noted to have a post void bladder residual of 100 mL. Renal ultrasound negative for hydronephrosis.  He will need to follow-up with urologist post discharge.    2.Tick bite Doxycycline was empirically added 06/30/16 after the patient revealed that he had a tick bite. [facility] spotted fever IgM negative, IgG elevated but not significant and negative Lyme disease PCR studies. Discontinue doxycycline  3. acute encephalopathy. The patient is confused at his baseline and presents with worsening delirium likely in the setting of sepsis and UTI. No asterixis. No focal deficit on examination. Syndrome shows somewhat improvement.  Avoid benzodiazepine unless necessary. Avoid narcotics.  4. Poorly controlled essential hypertension The patient's blood pressure was soft on admission, consistent with sepsis. Blood pressure has improved with fluid volume resuscitation. Resume lisinopril now that renal function back to baseline. Continue labetalol and minoxidil. Lasix remains on hold.  5.History of CVA with left-sided hemiparesis Continue secondary prevention.  6.Dyslipidemia Continue statin.  7.Depression and anxiety Continue Celexa and Xanax.  8.GERD Continue PPI.  9.  Diarrhea Discontinue scheduled stool softner and add probiotics.    All other chronic medical condition were stable during the hospitalization.  Patient was seen by physical therapy, who recommended SNF, which was arranged by Education officer, museum and case Freight forwarder. On the day of the discharge the patient's vitals were stable, and no other acute medical condition were reported by patient. the patient was felt safe to be discharge  at SNF with therapy.  Procedures and Results:  none    Consultations:  none  DISCHARGE MEDICATION: Current Discharge Medication List    START taking these medications   Details  ampicillin (PRINCIPEN) 500 MG capsule Take 1 capsule (500 mg total) by mouth every 6 (six) hours. Qty: 6 capsule, Refills: 0    ondansetron (ZOFRAN) 4 MG tablet Take 1 tablet (4 mg total) by mouth every 6 (six) hours as needed for nausea. Qty: 20 tablet, Refills: 0    saccharomyces boulardii (FLORASTOR) 250 MG capsule Take 1 capsule (250 mg total) by mouth 2 (two) times daily. Qty: 10 capsule, Refills: 0    tamsulosin (FLOMAX) 0.4 MG CAPS capsule Take 1 capsule (0.4 mg total) by mouth daily. Qty: 30 capsule, Refills: 0      CONTINUE these medications which have CHANGED   Details  docusate sodium (STOOL SOFTENER) 100 MG capsule Take 1 capsule (100 mg total) by mouth 2 (two) times daily as needed. Qty: 10 capsule, Refills: 0    furosemide (LASIX) 20 MG tablet Take 1 tablet (20 mg total) by mouth daily as needed for fluid or edema. Qty: 30 tablet, Refills: 0      CONTINUE these medications which have NOT CHANGED   Details  ALPRAZolam (XANAX) 0.5 MG tablet Take 0.25 mg by mouth daily as needed for anxiety.     cetirizine (ZYRTEC) 10 MG tablet Take 10 mg by mouth daily.    citalopram (CELEXA) 20 MG tablet Take 20 mg by mouth daily.    labetalol (NORMODYNE) 200 MG tablet Take 400 mg by mouth 2 (two) times daily.    lisinopril (PRINIVIL,ZESTRIL) 40 MG tablet Take 80 mg by mouth daily.    methylphenidate (RITALIN) 10 MG tablet Take 10 mg by mouth 2 (two) times daily.    minoxidil (LONITEN) 10 MG tablet Take 10 mg by mouth 2 (two) times daily.    Multiple Vitamins-Minerals (MULTIVITAMIN ADULT PO) Take 1 tablet by mouth daily.    omeprazole (PRILOSEC) 20 MG capsule Take 20 mg by mouth daily.    simvastatin (ZOCOR) 40 MG tablet Take 40 mg by mouth daily.      STOP taking these medications     MYRBETRIQ 25 MG TB24 tablet        No Known  Allergies Discharge Instructions    Diet - low sodium heart healthy    Complete by:  As directed   Discharge instructions    Complete by:  As directed   It is important that you read following instructions as well as go over your medication list with RN to help you understand your care after this hospitalization.  Discharge Instructions: Please follow-up with PCP in one week  Please request your primary care physician to go over all Hospital Tests and Procedure/Radiological results at the follow up,  Please get all Hospital records sent to your PCP by signing hospital release before you go home.   Do not take more than prescribed Pain, Sleep and Anxiety Medications. You were cared for by a hospitalist during your hospital stay. If you have any questions about your discharge medications or the care you received while you were in the hospital after you are discharged, you can call the unit and ask to speak with the hospitalist on call if the hospitalist that took care of you is not available.  Once you are discharged, your primary care physician will handle any further medical issues. Please note that NO REFILLS  for any discharge medications will be authorized once you are discharged, as it is imperative that you return to your primary care physician (or establish a relationship with a primary care physician if you do not have one) for your aftercare needs so that they can reassess your need for medications and monitor your lab values. You Must read complete instructions/literature along with all the possible adverse reactions/side effects for all the Medicines you take and that have been prescribed to you. Take any new Medicines after you have completely understood and accept all the possible adverse reactions/side effects. Wear Seat belts while driving.   Increase activity slowly    Complete by:  As directed     Discharge Exam: Filed Weights   06/30/16 0506 07/01/16 0458 07/03/16 0347   Weight: 90.5 kg (199 lb 9.6 oz) 89.4 kg (197 lb 1.6 oz) 89 kg (196 lb 4.8 oz)   Vitals:   07/03/16 0347 07/03/16 1040  BP: (!) 165/93 (!) 162/98  Pulse: 83 88  Resp: 20   Temp: 99.2 F (37.3 C)    General: Appear in mild distress, no Rash; Oral Mucosa moist. Cardiovascular: S1 and S2 Present, no Murmur, no JVD Respiratory: Bilateral Air entry present and Clear to Auscultation, no Crackles, no wheezes Abdomen: Bowel Sound present, Soft and no tenderness Extremities: no Pedal edema, no calf tenderness Neurology: Grossly no focal neuro deficit.  The results of significant diagnostics from this hospitalization (including imaging, microbiology, ancillary and laboratory) are listed below for reference.    Significant Diagnostic Studies: Dg Chest 2 View  Result Date: 06/28/2016 CLINICAL DATA:  Per [facility]- Pt resides at home. Pt presents with altered mental status- not normal. Pt with UTI symptoms started yesterday. HX of over active bladder, Frequency, hematuria dysuria This am. Fever 101.1 tympanic for EMS. EXAM: CHEST  2 VIEW COMPARISON:  None. FINDINGS: Cardiac silhouette is mildly enlarged. No mediastinal or hilar masses.  No evidence of adenopathy. Lung volumes are low. Allowing for this, lungs are clear. No pleural effusion or pneumothorax. Skeletal structures are unremarkable. IMPRESSION: No active cardiopulmonary disease. Electronically Signed   By: Lajean Manes M.D.   On: 06/28/2016 12:18   US Renal  Result Date: 06/28/2016 CLINICAL DATA:  Acute renal failure EXAM: RENAL / URINARY TRACT ULTRASOUND COMPLETE COMPARISON:  None. FINDINGS: Right Kidney: Length: 10.3 cm. Echogenicity within normal limits. No mass or hydronephrosis visualized. Left Kidney: Length: 12.4 cm. Echogenicity within normal limits. No mass or hydronephrosis visualized. Bladder: Appears normal for degree of bladder distention. IMPRESSION: No acute abnormality noted. Electronically Signed   By: Inez Catalina M.D.   On:  06/28/2016 17:55    Microbiology: Recent Results (from the past 240 hour(s))  Blood Culture (routine x 2)     Status: None   Collection Time: 06/28/16 11:15 AM  Result Value Ref Range Status   Specimen Description BLOOD RIGHT ANTECUBITAL  Final   Special Requests NONE  Final   Culture   Final    NO GROWTH 5 DAYS Performed at [facility]    Report Status 07/03/2016 FINAL  Final  Blood Culture (routine x 2)     Status: None   Collection Time: 06/28/16 11:20 AM  Result Value Ref Range Status   Specimen Description BLOOD LEFT ANTECUBITAL  Final   Special Requests NONE  Final   Culture   Final    NO GROWTH 5 DAYS Performed at [facility]    Report Status 07/03/2016 FINAL  Final  Urine culture     Status: Abnormal   Collection Time: 06/28/16  1:10 PM  Result Value Ref Range Status   Specimen Description URINE, CATHETERIZED  Final   Special Requests NONE  Final   Culture >=100,000 COLONIES/mL ESCHERICHIA COLI (A)  Final   Report Status 06/30/2016 FINAL  Final   Organism ID, Bacteria ESCHERICHIA COLI (A)  Final      Susceptibility   Escherichia coli - MIC*    AMPICILLIN <=2 SENSITIVE Sensitive     CEFAZOLIN <=4 SENSITIVE Sensitive     CEFTRIAXONE <=1 SENSITIVE Sensitive     CIPROFLOXACIN <=0.25 SENSITIVE Sensitive     GENTAMICIN <=1 SENSITIVE Sensitive     IMIPENEM <=0.25 SENSITIVE Sensitive     NITROFURANTOIN <=16 SENSITIVE Sensitive     TRIMETH/SULFA <=20 SENSITIVE Sensitive     AMPICILLIN/SULBACTAM <=2 SENSITIVE Sensitive     PIP/TAZO <=4 SENSITIVE Sensitive     Extended ESBL NEGATIVE Sensitive     * >=100,000 COLONIES/mL ESCHERICHIA COLI     Labs: CBC:  Recent Labs Lab 06/28/16 1115 06/29/16 0454 06/30/16 0534 07/01/16 0501 07/02/16 0518  WBC 15.3* 18.8* 16.1* 10.7* 8.3  NEUTROABS 13.3*  --   --   --   --   HGB 14.5 12.6* 12.4* 11.9* 12.5*  HCT 41.4 35.6* 35.4* 34.0* 35.9*  MCV 82.5 83.4 82.7 81.9 81.0  PLT 151 132* 122* 147* [id number]   Basic  Metabolic Panel:  Recent Labs Lab 06/28/16 1115 06/29/16 0454 06/30/16 0534 07/02/16 0518  NA 135 138 135 136  K 3.9 3.9 3.5 3.6  CL 103 108 108 105  CO2 19* 22 21* 23  GLUCOSE 131* 106* 116* 132*  BUN 23* 18 17 18   CREATININE 1.27* 1.14 1.05 0.99  CALCIUM 9.4 7.9* 8.1* 8.5*  MG  --  1.4*  --   --    Liver Function Tests:  Recent Labs Lab 06/28/16 1115  AST 23  ALT 21  ALKPHOS 65  BILITOT 1.6*  PROT 7.9  ALBUMIN 4.8   Time spent: 30 minutes  Signed:  Joydan Gretzinger  [facility]  07/03/2016  , 1:16 PM

## 2017-03-13 ENCOUNTER — Other Ambulatory Visit: Payer: Self-pay | Admitting: Urology

## 2017-03-13 DIAGNOSIS — C61 Malignant neoplasm of prostate: Secondary | ICD-10-CM

## 2017-03-26 ENCOUNTER — Ambulatory Visit (HOSPITAL_COMMUNITY)
Admission: RE | Admit: 2017-03-26 | Discharge: 2017-03-26 | Disposition: A | Discharge status: Home / Self Care | Payer: Medicare Other | Source: Ambulatory Visit | Attending: Urology | Admitting: Urology

## 2017-03-26 DIAGNOSIS — K4090 Unilateral inguinal hernia, without obstruction or gangrene, not specified as recurrent: Secondary | ICD-10-CM | POA: Insufficient documentation

## 2017-03-26 DIAGNOSIS — C61 Malignant neoplasm of prostate: Secondary | ICD-10-CM

## 2017-03-26 LAB — POCT I-STAT CREATININE: Creatinine, Ser: 1.2 mg/dL (ref 0.61–1.24)

## 2017-03-26 MED ORDER — GADOBENATE DIMEGLUMINE 529 MG/ML IV SOLN
20.0000 mL | Freq: Once | INTRAVENOUS | Status: AC | PRN
  Administered 2017-03-26: 18 mL via INTRAVENOUS

## 2017-03-26 MED ORDER — LIDOCAINE HCL 2 % EX GEL: 1.0000 "application " | Freq: Once | CUTANEOUS | Status: DC

## 2017-08-10 ENCOUNTER — Ambulatory Visit: Payer: Self-pay | Admitting: General Surgery

## 2017-08-10 NOTE — H&P (Signed)
History of Present Illness Ralene Ok MD; 08/10/2017 3:23 PM) The patient is a 61 year old male who presents with an inguinal hernia. Referred by: Dr. Pilar Jarvis Chief Complaint: Patient is a 61 year old male with a past medical history significant for prostate cancer, stroke prostate 10 years ago and a left inguinal hernia that he states been there for approximately 10 years. He states the hernias gotten bigger. He states that is affecting his urinary incontinence and frequency. He states he has no overt pain at this time. Patient did have an MRI in May 2018 which revealed a large left inguinal hernia. It did contain bowel at that time.  There are no other modifying factors.    Review of Systems Ralene Ok, MD; 08/10/2017 3:24 PM) All other systems negative   Physical Exam Ralene Ok MD; 08/10/2017 3:23 PM) The physical exam findings are as follows: Note:Constitutional: No acute distress, conversant, appears stated age  Eyes: Anicteric sclerae, moist conjunctiva, no lid lag  Neck: No thyromegaly, trachea midline, no cervical lymphadenopathy  Lungs: Clear to auscultation biilaterally, normal respiratory effot  Cardiovascular: regular rate & rhythm, no murmurs, no peripheal edema, pedal pulses 2+  GI: Soft, no masses or hepatosplenomegaly, non-tender to palpation  MSK: Normal gait, no clubbing cyanosis, edema  Skin: No rashes, palpation reveals normal skin turgor  Psychiatric: Appropriate judgment and insight, oriented to person, place, and time  Abdomen Inspection Hernias - Inguinal hernia - Left - Incarcerated(Large left-sided, inguinal scrotal hernia).    Assessment & Plan Ralene Ok MD; 08/10/2017 3:24 PM) LEFT INGUINAL HERNIA (K40.90) Impression: 61 year old male with a history of prostate cancer, previous stroke, with a large left inguinal hernia.  1. The patient will like to proceed to the operating room for an open left inguinal hernia repair  with mesh.  2. I discussed with the patient the signs and symptoms of incarceration and strangulation and the need to proceed to the ER should they occur.  3. I discussed with the patient the risks and benefits of the procedure to include but not limited to: Infection, bleeding, damage to surrounding structures, possible need for further surgery, possible nerve pain, and possible recurrence. The patient was understanding and wishes to proceed.

## 2017-08-28 NOTE — Pre-Procedure Instructions (Signed)
    PAVEL GADD  08/28/2017      [facility] - [place], [place] - [address] [place] [address] Phone: [phone] Fax: [phone]    Your procedure is scheduled on 09/15/17.  Report to [facility] Admitting at [phone] A.M.  Call this number if you have problems the morning of surgery:  [phone]   Remember:  Do not eat food or drink liquids after midnight.  Take these medicines the morning of surgery with A SIP OF WATER ---xanax,celexa,labetalol,flomax   Do not wear jewelry, make-up or nail polish.  Do not wear lotions, powders, or perfumes, or deoderant.  Do not shave 48 hours prior to surgery.  Men may shave face and neck.  Do not bring valuables to the hospital.  [facility] is not responsible for any belongings or valuables.  Contacts, dentures or bridgework may not be worn into surgery.  Leave your suitcase in the car.  After surgery it may be brought to your room.  For patients admitted to the hospital, discharge time will be determined by your treatment team.  Patients discharged the day of surgery will not be allowed to drive home.   Name and phone number of your driver:    Special instructions:  Do not take any aspirin,anti-inflammatories,vitamins,or herbal supplements 5-7 days prior to surgery.  Please read over the following fact sheets that you were given.

## 2017-08-31 ENCOUNTER — Encounter (HOSPITAL_COMMUNITY): Payer: Self-pay

## 2017-08-31 ENCOUNTER — Encounter (HOSPITAL_COMMUNITY)
Admission: RE | Admit: 2017-08-31 | Discharge: 2017-08-31 | Disposition: A | Discharge status: Home / Self Care | Payer: Medicare Other | Source: Ambulatory Visit | Attending: General Surgery | Admitting: General Surgery

## 2017-08-31 DIAGNOSIS — I10 Essential (primary) hypertension: Secondary | ICD-10-CM | POA: Diagnosis not present

## 2017-08-31 DIAGNOSIS — Z01810 Encounter for preprocedural cardiovascular examination: Secondary | ICD-10-CM | POA: Insufficient documentation

## 2017-08-31 DIAGNOSIS — Z01812 Encounter for preprocedural laboratory examination: Secondary | ICD-10-CM | POA: Diagnosis not present

## 2017-08-31 HISTORY — DX: Depression, unspecified: F32.A

## 2017-08-31 HISTORY — DX: Anxiety disorder, unspecified: F41.9

## 2017-08-31 HISTORY — DX: Major depressive disorder, single episode, unspecified: F32.9

## 2017-08-31 HISTORY — DX: Unspecified osteoarthritis, unspecified site: M19.90

## 2017-08-31 HISTORY — DX: Acute embolism and thrombosis of unspecified vein: I82.90

## 2017-08-31 LAB — BASIC METABOLIC PANEL
ANION GAP: 9 (ref 5–15)
BUN: 18 mg/dL (ref 6–20)
CO2: 25 mmol/L (ref 22–32)
Calcium: 9.4 mg/dL (ref 8.9–10.3)
Chloride: 103 mmol/L (ref 101–111)
Creatinine, Ser: 1.18 mg/dL (ref 0.61–1.24)
GFR calc Af Amer: >60 mL/min (ref >60)
GLUCOSE: 115 mg/dL — AB (ref 65–99)
Potassium: 4.2 mmol/L (ref 3.5–5.1)
Sodium: 137 mmol/L (ref 135–145)

## 2017-08-31 LAB — CBC
HEMATOCRIT: 40.5 % (ref 39.0–52.0)
HEMOGLOBIN: 14.2 g/dL (ref 13.0–17.0)
MCH: 29 pg (ref 26.0–34.0)
MCHC: 35.1 g/dL (ref 30.0–36.0)
MCV: 82.8 fL (ref 78.0–100.0)
Platelets: 163 10*3/uL (ref 150–400)
RBC: 4.89 MIL/uL (ref 4.22–5.81)
RDW: 13.5 % (ref 11.5–15.5)
WBC: 6.2 10*3/uL (ref 4.0–10.5)

## 2017-08-31 MED ORDER — CHLORHEXIDINE GLUCONATE CLOTH 2 % EX PADS: 6.0000 | MEDICATED_PAD | Freq: Once | CUTANEOUS | Status: DC

## 2017-08-31 MED ORDER — ENSURE SURGERY PO LIQD: 237.0000 mL | Freq: Two times a day (BID) | ORAL | Status: DC

## 2017-08-31 MED ORDER — ENSURE PRE-SURGERY PO LIQD: 592.0000 mL | Freq: Once | ORAL | Status: DC

## 2017-08-31 MED ORDER — ENSURE PRE-SURGERY PO LIQD: 296.0000 mL | Freq: Once | ORAL | Status: DC

## 2017-09-15 ENCOUNTER — Encounter (HOSPITAL_COMMUNITY)
Admission: RE | Disposition: A | Discharge status: Home / Self Care | Payer: Self-pay | Source: Ambulatory Visit | Attending: General Surgery

## 2017-09-15 ENCOUNTER — Ambulatory Visit (HOSPITAL_COMMUNITY): Payer: Medicare Other | Admitting: Anesthesiology

## 2017-09-15 ENCOUNTER — Observation Stay (HOSPITAL_COMMUNITY)
Admission: RE | Admit: 2017-09-15 | Discharge: 2017-09-16 | Disposition: A | Discharge status: Home / Self Care | Payer: Medicare Other | Source: Ambulatory Visit | Attending: General Surgery | Admitting: General Surgery

## 2017-09-15 ENCOUNTER — Encounter (HOSPITAL_COMMUNITY): Payer: Self-pay

## 2017-09-15 DIAGNOSIS — K5900 Constipation, unspecified: Secondary | ICD-10-CM | POA: Insufficient documentation

## 2017-09-15 DIAGNOSIS — K4090 Unilateral inguinal hernia, without obstruction or gangrene, not specified as recurrent: Secondary | ICD-10-CM | POA: Diagnosis present

## 2017-09-15 DIAGNOSIS — I10 Essential (primary) hypertension: Secondary | ICD-10-CM | POA: Insufficient documentation

## 2017-09-15 DIAGNOSIS — K4030 Unilateral inguinal hernia, with obstruction, without gangrene, not specified as recurrent: Principal | ICD-10-CM | POA: Insufficient documentation

## 2017-09-15 DIAGNOSIS — Z8546 Personal history of malignant neoplasm of prostate: Secondary | ICD-10-CM | POA: Insufficient documentation

## 2017-09-15 DIAGNOSIS — Z8673 Personal history of transient ischemic attack (TIA), and cerebral infarction without residual deficits: Secondary | ICD-10-CM | POA: Insufficient documentation

## 2017-09-15 DIAGNOSIS — R350 Frequency of micturition: Secondary | ICD-10-CM | POA: Diagnosis not present

## 2017-09-15 DIAGNOSIS — R32 Unspecified urinary incontinence: Secondary | ICD-10-CM | POA: Insufficient documentation

## 2017-09-15 HISTORY — PX: INGUINAL HERNIA REPAIR: SHX194

## 2017-09-15 LAB — CREATININE, SERUM
Creatinine, Ser: 1.2 mg/dL (ref 0.61–1.24)
GFR calc Af Amer: >60 mL/min (ref >60)

## 2017-09-15 LAB — CBC
HEMATOCRIT: 42.4 % (ref 39.0–52.0)
Hemoglobin: 14.8 g/dL (ref 13.0–17.0)
MCH: 29 pg (ref 26.0–34.0)
MCHC: 34.9 g/dL (ref 30.0–36.0)
MCV: 83.1 fL (ref 78.0–100.0)
PLATELETS: 161 10*3/uL (ref 150–400)
RBC: 5.1 MIL/uL (ref 4.22–5.81)
RDW: 13.8 % (ref 11.5–15.5)
WBC: 14.9 10*3/uL — AB (ref 4.0–10.5)

## 2017-09-15 SURGERY — REPAIR, HERNIA, INGUINAL, ADULT
Anesthesia: General | Site: Groin | Laterality: Left

## 2017-09-15 MED ORDER — METHOCARBAMOL 500 MG PO TABS: 500.0000 mg | ORAL_TABLET | Freq: Four times a day (QID) | ORAL | Status: DC | PRN

## 2017-09-15 MED ORDER — SODIUM CHLORIDE 0.9% FLUSH: 3.0000 mL | INTRAVENOUS | Status: DC | PRN

## 2017-09-15 MED ORDER — GABAPENTIN 300 MG PO CAPS
300.0000 mg | ORAL_CAPSULE | ORAL | Status: AC
  Administered 2017-09-15: 300 mg via ORAL

## 2017-09-15 MED ORDER — SUGAMMADEX SODIUM 200 MG/2ML IV SOLN
INTRAVENOUS | Status: AC
  Filled 2017-09-15: qty 2

## 2017-09-15 MED ORDER — SUGAMMADEX SODIUM 200 MG/2ML IV SOLN
INTRAVENOUS | Status: DC | PRN
  Administered 2017-09-15: 200 mg via INTRAVENOUS

## 2017-09-15 MED ORDER — ROCURONIUM BROMIDE 100 MG/10ML IV SOLN
INTRAVENOUS | Status: DC | PRN
  Administered 2017-09-15: 50 mg via INTRAVENOUS

## 2017-09-15 MED ORDER — FENTANYL CITRATE (PF) 250 MCG/5ML IJ SOLN
INTRAMUSCULAR | Status: AC
  Filled 2017-09-15: qty 5

## 2017-09-15 MED ORDER — HEPARIN SODIUM (PORCINE) 5000 UNIT/ML IJ SOLN
5000.0000 [IU] | Freq: Three times a day (TID) | INTRAMUSCULAR | Status: DC
  Administered 2017-09-15 – 2017-09-16 (×2): 5000 [IU] via SUBCUTANEOUS
  Filled 2017-09-15 (×2): qty 1

## 2017-09-15 MED ORDER — BUPIVACAINE-EPINEPHRINE (PF) 0.25% -1:200000 IJ SOLN
INTRAMUSCULAR | Status: AC
  Filled 2017-09-15: qty 30

## 2017-09-15 MED ORDER — CEFAZOLIN SODIUM-DEXTROSE 2-4 GM/100ML-% IV SOLN
INTRAVENOUS | Status: AC
  Filled 2017-09-15: qty 100

## 2017-09-15 MED ORDER — ONDANSETRON HCL 4 MG/2ML IJ SOLN: 4.0000 mg | Freq: Four times a day (QID) | INTRAMUSCULAR | Status: DC | PRN

## 2017-09-15 MED ORDER — CELECOXIB 200 MG PO CAPS
200.0000 mg | ORAL_CAPSULE | ORAL | Status: AC
  Administered 2017-09-15: 200 mg via ORAL

## 2017-09-15 MED ORDER — DEXTROSE 5 % IV SOLN
INTRAVENOUS | Status: DC | PRN
  Administered 2017-09-15: 20 ug/min via INTRAVENOUS

## 2017-09-15 MED ORDER — DEXAMETHASONE SODIUM PHOSPHATE 10 MG/ML IJ SOLN
INTRAMUSCULAR | Status: AC
  Filled 2017-09-15: qty 1

## 2017-09-15 MED ORDER — MIDAZOLAM HCL 2 MG/2ML IJ SOLN
INTRAMUSCULAR | Status: AC
  Filled 2017-09-15: qty 2

## 2017-09-15 MED ORDER — PROPOFOL 10 MG/ML IV BOLUS
INTRAVENOUS | Status: AC
  Filled 2017-09-15: qty 20

## 2017-09-15 MED ORDER — GABAPENTIN 300 MG PO CAPS
ORAL_CAPSULE | ORAL | Status: AC
  Administered 2017-09-15: 300 mg via ORAL
  Filled 2017-09-15: qty 1

## 2017-09-15 MED ORDER — MIDAZOLAM HCL 5 MG/5ML IJ SOLN
INTRAMUSCULAR | Status: DC | PRN
  Administered 2017-09-15 (×2): 1 mg via INTRAVENOUS

## 2017-09-15 MED ORDER — ONDANSETRON HCL 4 MG/2ML IJ SOLN
INTRAMUSCULAR | Status: AC
  Filled 2017-09-15: qty 2

## 2017-09-15 MED ORDER — FENTANYL CITRATE (PF) 100 MCG/2ML IJ SOLN
INTRAMUSCULAR | Status: DC | PRN
  Administered 2017-09-15: 50 ug via INTRAVENOUS

## 2017-09-15 MED ORDER — ACETAMINOPHEN 325 MG PO TABS
650.0000 mg | ORAL_TABLET | ORAL | Status: DC | PRN
  Administered 2017-09-15: 650 mg via ORAL
  Filled 2017-09-15: qty 2

## 2017-09-15 MED ORDER — MORPHINE SULFATE (PF) 4 MG/ML IV SOLN
2.0000 mg | INTRAVENOUS | Status: DC | PRN
Start: 2017-09-15 — End: 2017-09-16

## 2017-09-15 MED ORDER — BUPIVACAINE-EPINEPHRINE 0.25% -1:200000 IJ SOLN
INTRAMUSCULAR | Status: DC | PRN
Start: 2017-09-15 — End: 2017-09-15
  Administered 2017-09-15: 20 mL

## 2017-09-15 MED ORDER — CELECOXIB 200 MG PO CAPS
200.0000 mg | ORAL_CAPSULE | Freq: Two times a day (BID) | ORAL | Status: DC
  Administered 2017-09-15 – 2017-09-16 (×2): 200 mg via ORAL
  Filled 2017-09-15 (×2): qty 1

## 2017-09-15 MED ORDER — HYDROCODONE-ACETAMINOPHEN 5-325 MG PO TABS: 1.0000 | ORAL_TABLET | ORAL | Status: DC | PRN

## 2017-09-15 MED ORDER — MORPHINE SULFATE (PF) 4 MG/ML IV SOLN: 2.0000 mg | INTRAVENOUS | Status: DC | PRN

## 2017-09-15 MED ORDER — OXYCODONE HCL 5 MG PO TABS: 5.0000 mg | ORAL_TABLET | ORAL | Status: DC | PRN

## 2017-09-15 MED ORDER — DIPHENHYDRAMINE HCL 25 MG PO CAPS: 25.0000 mg | ORAL_CAPSULE | Freq: Four times a day (QID) | ORAL | Status: DC | PRN

## 2017-09-15 MED ORDER — LIDOCAINE 2% (20 MG/ML) 5 ML SYRINGE
INTRAMUSCULAR | Status: DC | PRN
  Administered 2017-09-15: 100 mg via INTRAVENOUS

## 2017-09-15 MED ORDER — LIDOCAINE 2% (20 MG/ML) 5 ML SYRINGE
INTRAMUSCULAR | Status: AC
  Filled 2017-09-15: qty 5

## 2017-09-15 MED ORDER — OXYCODONE-ACETAMINOPHEN 5-325 MG PO TABS: 1.0000 | ORAL_TABLET | Freq: Four times a day (QID) | ORAL | 0 refills | Status: AC | PRN

## 2017-09-15 MED ORDER — 0.9 % SODIUM CHLORIDE (POUR BTL) OPTIME
TOPICAL | Status: DC | PRN
  Administered 2017-09-15: 1000 mL

## 2017-09-15 MED ORDER — ONDANSETRON HCL 4 MG/2ML IJ SOLN
INTRAMUSCULAR | Status: DC | PRN
  Administered 2017-09-15: 4 mg via INTRAVENOUS

## 2017-09-15 MED ORDER — LACTATED RINGERS IV SOLN
INTRAVENOUS | Status: DC | PRN
  Administered 2017-09-15 (×2): via INTRAVENOUS

## 2017-09-15 MED ORDER — DIPHENHYDRAMINE HCL 50 MG/ML IJ SOLN: 25.0000 mg | Freq: Four times a day (QID) | INTRAMUSCULAR | Status: DC | PRN

## 2017-09-15 MED ORDER — CEFAZOLIN SODIUM-DEXTROSE 2-4 GM/100ML-% IV SOLN
2.0000 g | INTRAVENOUS | Status: AC
  Administered 2017-09-15: 2 g via INTRAVENOUS

## 2017-09-15 MED ORDER — ACETAMINOPHEN 650 MG RE SUPP: 650.0000 mg | RECTAL | Status: DC | PRN

## 2017-09-15 MED ORDER — GABAPENTIN 300 MG PO CAPS
300.0000 mg | ORAL_CAPSULE | Freq: Two times a day (BID) | ORAL | Status: DC
  Administered 2017-09-15 – 2017-09-16 (×2): 300 mg via ORAL
  Filled 2017-09-15 (×2): qty 1

## 2017-09-15 MED ORDER — SODIUM CHLORIDE 0.9% FLUSH: 3.0000 mL | Freq: Two times a day (BID) | INTRAVENOUS | Status: DC

## 2017-09-15 MED ORDER — PROPOFOL 10 MG/ML IV BOLUS
INTRAVENOUS | Status: DC | PRN
  Administered 2017-09-15: 50 mg via INTRAVENOUS
  Administered 2017-09-15: 100 mg via INTRAVENOUS
  Administered 2017-09-15: 30 mg via INTRAVENOUS

## 2017-09-15 MED ORDER — SODIUM CHLORIDE 0.9 % IV SOLN: 250.0000 mL | INTRAVENOUS | Status: DC | PRN

## 2017-09-15 MED ORDER — ACETAMINOPHEN 500 MG PO TABS
1000.0000 mg | ORAL_TABLET | ORAL | Status: AC
  Administered 2017-09-15: 1000 mg via ORAL

## 2017-09-15 MED ORDER — HYDRALAZINE HCL 20 MG/ML IJ SOLN: 10.0000 mg | INTRAMUSCULAR | Status: DC | PRN

## 2017-09-15 MED ORDER — SODIUM CHLORIDE 0.9 % IV SOLN
INTRAVENOUS | Status: DC
  Administered 2017-09-15: 20:00:00 via INTRAVENOUS

## 2017-09-15 MED ORDER — CELECOXIB 200 MG PO CAPS
ORAL_CAPSULE | ORAL | Status: AC
  Administered 2017-09-15: 200 mg via ORAL
  Filled 2017-09-15: qty 1

## 2017-09-15 MED ORDER — ONDANSETRON 4 MG PO TBDP: 4.0000 mg | ORAL_TABLET | Freq: Four times a day (QID) | ORAL | Status: DC | PRN

## 2017-09-15 MED ORDER — ACETAMINOPHEN 500 MG PO TABS
ORAL_TABLET | ORAL | Status: AC
  Administered 2017-09-15: 1000 mg via ORAL
  Filled 2017-09-15: qty 2

## 2017-09-15 MED ORDER — DEXAMETHASONE SODIUM PHOSPHATE 10 MG/ML IJ SOLN
INTRAMUSCULAR | Status: DC | PRN
  Administered 2017-09-15: 10 mg via INTRAVENOUS

## 2017-09-15 SURGICAL SUPPLY — 54 items
BLADE CLIPPER SURG (BLADE) ×3 IMPLANT
CANISTER SUCT 3000ML PPV (MISCELLANEOUS) ×3 IMPLANT
CHLORAPREP W/TINT 26ML (MISCELLANEOUS) ×3 IMPLANT
CONT SPECI 4OZ STER CLIK (MISCELLANEOUS) ×3 IMPLANT
COVER SURGICAL LIGHT HANDLE (MISCELLANEOUS) ×3 IMPLANT
DERMABOND ADVANCED (GAUZE/BANDAGES/DRESSINGS) ×2
DERMABOND ADVANCED .7 DNX12 (GAUZE/BANDAGES/DRESSINGS) ×1 IMPLANT
DRAIN PENROSE 1/2X12 LTX STRL (WOUND CARE) ×3 IMPLANT
DRAPE LAPAROTOMY TRNSV 102X78 (DRAPE) ×3 IMPLANT
DRAPE UTILITY XL STRL (DRAPES) ×3 IMPLANT
ELECT CAUTERY BLADE 6.4 (BLADE) ×3 IMPLANT
ELECT REM PT RETURN 9FT ADLT (ELECTROSURGICAL) ×3
ELECTRODE REM PT RTRN 9FT ADLT (ELECTROSURGICAL) ×1 IMPLANT
GAUZE SPONGE 4X4 16PLY XRAY LF (GAUZE/BANDAGES/DRESSINGS) ×3 IMPLANT
GLOVE BIO SURGEON STRL SZ7.5 (GLOVE) ×3 IMPLANT
GLOVE BIOGEL PI IND STRL 6.5 (GLOVE) ×1 IMPLANT
GLOVE BIOGEL PI IND STRL 8 (GLOVE) ×1 IMPLANT
GLOVE BIOGEL PI INDICATOR 6.5 (GLOVE) ×2
GLOVE BIOGEL PI INDICATOR 8 (GLOVE) ×2
GOWN STRL REUS W/ TWL LRG LVL3 (GOWN DISPOSABLE) ×1 IMPLANT
GOWN STRL REUS W/ TWL XL LVL3 (GOWN DISPOSABLE) ×1 IMPLANT
GOWN STRL REUS W/TWL LRG LVL3 (GOWN DISPOSABLE) ×2
GOWN STRL REUS W/TWL XL LVL3 (GOWN DISPOSABLE) ×2
KIT BASIN OR (CUSTOM PROCEDURE TRAY) ×3 IMPLANT
KIT ROOM TURNOVER OR (KITS) ×3 IMPLANT
MESH PARIETEX PROGRIP LEFT (Mesh General) ×3 IMPLANT
NEEDLE HYPO 25GX1X1/2 BEV (NEEDLE) ×3 IMPLANT
NS IRRIG 1000ML POUR BTL (IV SOLUTION) ×3 IMPLANT
PACK SURGICAL SETUP 50X90 (CUSTOM PROCEDURE TRAY) ×3 IMPLANT
PAD ARMBOARD 7.5X6 YLW CONV (MISCELLANEOUS) ×6 IMPLANT
PENCIL BUTTON HOLSTER BLD 10FT (ELECTRODE) ×3 IMPLANT
SPECIMEN JAR SMALL (MISCELLANEOUS) ×3 IMPLANT
SPONGE INTESTINAL PEANUT (DISPOSABLE) IMPLANT
SPONGE LAP 18X18 X RAY DECT (DISPOSABLE) ×3 IMPLANT
SUT MNCRL AB 4-0 PS2 18 (SUTURE) ×3 IMPLANT
SUT PDS AB 0 CT 36 (SUTURE) IMPLANT
SUT PROLENE 2 0 SH DA (SUTURE) ×6 IMPLANT
SUT VIC AB 0 CT2 27 (SUTURE) IMPLANT
SUT VIC AB 2-0 SH 27 (SUTURE) ×8
SUT VIC AB 2-0 SH 27X BRD (SUTURE) ×1 IMPLANT
SUT VIC AB 2-0 SH 27XBRD (SUTURE) ×3 IMPLANT
SUT VIC AB 3-0 SH 27 (SUTURE) ×2
SUT VIC AB 3-0 SH 27XBRD (SUTURE) ×1 IMPLANT
SUT VICRYL AB 2 0 TIES (SUTURE) ×3 IMPLANT
SYR BULB 3OZ (MISCELLANEOUS) ×3 IMPLANT
SYR CONTROL 10ML LL (SYRINGE) ×3 IMPLANT
SYRINGE TOOMEY DISP (SYRINGE) ×3 IMPLANT
TOWEL OR 17X24 6PK STRL BLUE (TOWEL DISPOSABLE) ×3 IMPLANT
TOWEL OR 17X26 10 PK STRL BLUE (TOWEL DISPOSABLE) ×3 IMPLANT
TRAY FOLEY CATH SILVER 16FR (SET/KITS/TRAYS/PACK) ×3 IMPLANT
TUBE CONNECTING 12'X1/4 (SUCTIONS) ×1
TUBE CONNECTING 12X1/4 (SUCTIONS) ×2 IMPLANT
WATER STERILE IRR 1000ML POUR (IV SOLUTION) ×3 IMPLANT
YANKAUER SUCT BULB TIP NO VENT (SUCTIONS) ×3 IMPLANT

## 2017-09-15 NOTE — OR Nursing (Signed)
Instilled 100cc of sterile water into bladder via foley catheter and then removed foley.

## 2017-09-15 NOTE — Anesthesia Procedure Notes (Signed)
Procedure Name: Intubation Date/Time: 09/15/2017 12:51 PM Performed by: Kyung Rudd Pre-anesthesia Checklist: Patient identified, Emergency Drugs available, Suction available and Patient being monitored Patient Re-evaluated:Patient Re-evaluated prior to induction Oxygen Delivery Method: Circle system utilized Preoxygenation: Pre-oxygenation with 100% oxygen Induction Type: IV induction Ventilation: Mask ventilation without difficulty Laryngoscope Size: Mac and 3 Grade View: Grade II Tube type: Oral Tube size: 7.5 mm Number of attempts: 1 Airway Equipment and Method: Stylet Placement Confirmation: ETT inserted through vocal cords under direct vision,  positive ETCO2 and breath sounds checked- equal and bilateral Secured at: 20 cm Tube secured with: Tape Dental Injury: Teeth and Oropharynx as per pre-operative assessment

## 2017-09-15 NOTE — H&P (Signed)
History of Present Illness  The patient is a 61 year old male who presents with an inguinal hernia. Referred by: Dr. Pilar Jarvis Chief Complaint: Patient is a 60 year old male with a past medical history significant for prostate cancer, stroke prostate 10 years ago and a left inguinal hernia that he states been there for approximately 10 years. He states the hernias gotten bigger. He states that is affecting his urinary incontinence and frequency. He states he has no overt pain at this time. Patient did have an MRI in May 2018 which revealed a large left inguinal hernia. It did contain bowel at that time.  There are no other modifying factors.    Review of Systems All other systems negative  BP 111/66   Pulse 66   Temp 98.1 F (36.7 C) (Oral)   Resp 18   Ht 5\' 8"  (1.727 m)   Wt 86.2 kg (190 lb)   SpO2 97%   BMI 28.89 kg/m   Physical Exam The physical exam findings are as follows: Note:Constitutional: No acute distress, conversant, appears stated age  Eyes: Anicteric sclerae, moist conjunctiva, no lid lag  Neck: No thyromegaly, trachea midline, no cervical lymphadenopathy  Lungs: Clear to auscultation biilaterally, normal respiratory effot  Cardiovascular: regular rate & rhythm, no murmurs, no peripheal edema, pedal pulses 2+  GI: Soft, no masses or hepatosplenomegaly, non-tender to palpation  MSK: Normal gait, no clubbing cyanosis, edema  Skin: No rashes, palpation reveals normal skin turgor  Psychiatric: Appropriate judgment and insight, oriented to person, place, and time  Abdomen Inspection Hernias - Inguinal hernia - Left - Incarcerated(Large left-sided, inguinal scrotal hernia).    Assessment & Plan LEFT INGUINAL HERNIA (K40.90) Impression: 61 year old male with a history of prostate cancer, previous stroke, with a large left inguinal hernia.  1. The patient will like to proceed to the operating room for an open left inguinal hernia  repair with mesh.  2. I discussed with the patient the signs and symptoms of incarceration and strangulation and the need to proceed to the ER should they occur.  3. I discussed with the patient the risks and benefits of the procedure to include but not limited to: Infection, bleeding, damage to surrounding structures, possible need for further surgery, possible nerve pain, and possible recurrence. The patient was understanding and wishes to proceed.

## 2017-09-15 NOTE — Progress Notes (Signed)
Patient sister brought to PACU for discharge. She was very concerned about his ability to ambulate into the house. She stated she would just call 911 if she could not get him in the house. It did take 2 staff to stand patient to the restroom. I did not think the patient could get into his home. Call dr. Rosendo Gros to request admit orders. Ok to admit per Dr.Ramirez but need to page on call dr for orders at this time. Orders given. Will continue to monitor patient.

## 2017-09-15 NOTE — Discharge Instructions (Signed)
[facility] _______[facility] °INGUINAL HERNIA REPAIR: POST OP INSTRUCTIONS ° °Always review your discharge instruction sheet given to you by the facility where your surgery was performed. °IF YOU HAVE DISABILITY OR FAMILY LEAVE FORMS, YOU MUST BRING THEM TO THE OFFICE FOR PROCESSING.   °DO NOT GIVE THEM TO YOUR DOCTOR. ° °1. A  prescription for pain medication may be given to you upon discharge.  Take your pain medication as prescribed, if needed.  If narcotic pain medicine is not needed, then you may take acetaminophen (Tylenol) or ibuprofen (Advil) as needed. °2. Take your usually prescribed medications unless otherwise directed. °If you need a refill on your pain medication, please contact your pharmacy.  They will contact our office to request authorization. Prescriptions will not be filled after 5 pm or on week-ends. °3. You should follow a light diet the first 24 hours after arrival home, such as soup and crackers, etc.  Be sure to include lots of fluids daily.  Resume your normal diet the day after surgery. °4.Most patients will experience some swelling and bruising around the umbilicus or in the groin and scrotum.  Ice packs and reclining will help.  Swelling and bruising can take several days to resolve.  °6. It is common to experience some constipation if taking pain medication after surgery.  Increasing fluid intake and taking a stool softener (such as Colace) will usually help or prevent this problem from occurring.  A mild laxative (Milk of Magnesia or Miralax) should be taken according to package directions if there are no bowel movements after 48 hours. °7. Unless discharge instructions indicate otherwise, you may remove your bandages 24-48 hours after surgery, and you may shower at that time.  You may have steri-strips (small skin tapes) in place directly over the incision.  These strips should be left on the skin for 7-10 days.  If your surgeon used skin glue on the incision, you may  shower in 24 hours.  The glue will flake off over the next 2-3 weeks.  Any sutures or staples will be removed at the office during your follow-up visit. °8. ACTIVITIES:  You may resume regular (light) daily activities beginning the next day--such as daily self-care, walking, climbing stairs--gradually increasing activities as tolerated.  You may have sexual intercourse when it is comfortable.  Refrain from any heavy lifting or straining until approved by your doctor. ° °a.You may drive when you are no longer taking prescription pain medication, you can comfortably wear a seatbelt, and you can safely maneuver your car and apply brakes. °b.RETURN TO WORK:   °_____________________________________________ ° °9.You should see your doctor in the office for a follow-up appointment approximately 2-3 weeks after your surgery.  Make sure that you call for this appointment within a day or two after you arrive home to insure a convenient appointment time. °10.OTHER INSTRUCTIONS: _________________________ °   _____________________________________ ° °WHEN TO CALL YOUR DOCTOR: °1. Fever over 101.0 °2. Inability to urinate °3. Nausea and/or vomiting °4. Extreme swelling or bruising °5. Continued bleeding from incision. °6. Increased pain, redness, or drainage from the incision ° °The clinic staff is available to answer your questions during regular business hours.  Please don’t hesitate to call and ask to speak to one of the nurses for clinical concerns.  If you have a medical emergency, go to the nearest emergency room or call 911.  A surgeon from [facility] is always on call at the hospital ° ° °[address], [address], [place], [place]  [address] ? ° [address] [address], [place], [place]   [address] °[phone] ? [phone] ? FAX [phone] °Web site: [email or web address] ° °

## 2017-09-15 NOTE — Anesthesia Preprocedure Evaluation (Addendum)
Anesthesia Evaluation  Patient identified by MRN, date of birth, ID band Patient awake    Reviewed: Allergy & Precautions, H&P , NPO status , Patient's Chart, lab work & pertinent test results, reviewed documented beta blocker date and time   Airway Mallampati: II  TM Distance: >3 FB Neck ROM: full    Dental no notable dental hx. (+) Teeth Intact,    Pulmonary    Pulmonary exam normal breath sounds clear to auscultation       Cardiovascular hypertension,  Rhythm:regular Rate:Normal     Neuro/Psych    GI/Hepatic   Endo/Other    Renal/GU      Musculoskeletal   Abdominal   Peds  Hematology   Anesthesia Other Findings   Reproductive/Obstetrics                            Anesthesia Physical Anesthesia Plan  ASA: II  Anesthesia Plan: General   Post-op Pain Management:    Induction: Intravenous  PONV Risk Score and Plan: 2 and Ondansetron, Dexamethasone and Treatment may vary due to age or medical condition  Airway Management Planned: LMA  Additional Equipment:   Intra-op Plan:   Post-operative Plan: Extubation in OR  Informed Consent: I have reviewed the patients History and Physical, chart, labs and discussed the procedure including the risks, benefits and alternatives for the proposed anesthesia with the patient or authorized representative who has indicated his/her understanding and acceptance.   Dental advisory given  Plan Discussed with: CRNA and Anesthesiologist  Anesthesia Plan Comments: (  )       Anesthesia Quick Evaluation

## 2017-09-15 NOTE — Anesthesia Postprocedure Evaluation (Signed)
Anesthesia Post Note  Patient: Bryan Bartlett  Procedure(s) Performed: OPEN LEFT INGUINAL HERNIA REPAIR WITH  ProGrip 12cm x 8cm MESH (Left Groin)     Patient location during evaluation: PACU Anesthesia Type: General Level of consciousness: awake, awake and alert and oriented Pain management: pain level controlled Vital Signs Assessment: post-procedure vital signs reviewed and stable Respiratory status: spontaneous breathing, nonlabored ventilation and respiratory function stable Cardiovascular status: blood pressure returned to baseline Anesthetic complications: no    Last Vitals:  Vitals:   09/15/17 1945 09/15/17 2010  BP: 131/79 (!) 145/88  Pulse: 66 68  Resp:  16  Temp:  37.1 C  SpO2: 97% 98%    Last Pain:  Vitals:   09/15/17 2035  TempSrc:   PainSc: 2                  Alexine Pilant COKER

## 2017-09-15 NOTE — Transfer of Care (Signed)
Immediate Anesthesia Transfer of Care Note  Patient: Bryan Bartlett  Procedure(s) Performed: OPEN LEFT INGUINAL HERNIA REPAIR WITH  ProGrip 12cm x 8cm MESH (Left Groin)  Patient Location: PACU  Anesthesia Type:General  Level of Consciousness: drowsy  Airway & Oxygen Therapy: Patient Spontanous Breathing and Patient connected to nasal cannula oxygen  Post-op Assessment: Report given to RN and Post -op Vital signs reviewed and stable  Post vital signs: Reviewed and stable  Last Vitals:  Vitals:   09/15/17 1043 09/15/17 1441  BP: 111/66   Pulse: 66   Resp: 18   Temp: 36.7 C (!) (P) 36.1 C  SpO2: 97%     Last Pain:  Vitals:   09/15/17 1441  TempSrc:   PainSc: (P) Asleep      Patients Stated Pain Goal: 3 (59/93/57 0177)  Complications: No apparent anesthesia complications

## 2017-09-15 NOTE — Op Note (Signed)
09/15/2017  2:20 PM  PATIENT:  Bryan Bartlett  61 y.o. male  PRE-OPERATIVE DIAGNOSIS:  LEFT INGUINAL HERNIA   POST-OPERATIVE DIAGNOSIS:  SLIDING, INCARCERATED BOWEL, LEFT INGUINAL HERNIA   PROCEDURE:  Procedure(s): OPEN LEFT INGUINAL HERNIA REPAIR WITH  ProGrip 12cm x 8cm MESH (Left)  SURGEON:  Surgeon(s) and Role:    Ralene Ok, MD - Primary  ANESTHESIA:   local, regional and general  EBL:   68mL   BLOOD ADMINISTERED:none  DRAINS: none   LOCAL MEDICATIONS USED:  BUPIVICAINE   SPECIMEN:  Source of Specimen:  Hernia sac  DISPOSITION OF SPECIMEN:  PATHOLOGY  COUNTS:  YES  TOURNIQUET:  * No tourniquets in log *  DICTATION: .Dragon Dictation  Indication for procedure: Patient is a 61 year old male who is referred by urology secondary to a left inguinal hernia.  This was a large inguinal scrotal inguinal hernia that was increasing in size and also cause some discomfort and constipation.  Patient was seen in clinic decided to have this likely repaired.  Details of procedure: After the patient was extend he was taken back to the operating room placed supine position with bowel dressings in place.  Patient was placed under general anesthesia.  Patient had a Foley catheter placed.  Patient was prepped and draped standard fashion.  Timeout was called and all facts were verified.   A 5 cm incision was made just 1 cm superior to the inguinal ligament. Bovie cautery was used to maintain hemostasis dissection is carried down to the external oblique.  A standard incision was made laterally, and the external oblique was bluntly dissected away from the surrounding tissue with Metzenbaum scissors. The external oblique was elevated in the spermatic cord was bluntly dissected away from the surrounding tissue.  The ilioinguinal nerve was identified and ligated with an 2-0 dyed vicryl.   The spermatic cord and the hernia were then bluntly dissected away from the pubic tubercle and  a Penrose was placed around the hernia sac in the spermatic cord. The vas deferens was identified and protected at all portions of the case. Dissection of the cremasterics took place with Bovie cautery. One the hernia sac was dissected away from the surrrounding cremesteric tissue , the hernia sac was entered laterally.  Within the hernia sac was a sliding, incarcerated component consisting of sigmoid colon.  Large amount of fatty colon mesentery was also within the hernia sac.  This was down into the scrotum.  This was completely reduced from the scrotum.  After continuous pressure and extending the incision at the internal oblique/internal ring medially.  The full sigmoid colon was able to be reduced back into the abdominal cavity.  At this time a running 3-0 silk was used to reapproximate the hernia sac itself.  The excess hernia sac was ligated using a 2-0 Vicryl x1.  The specimen was sent to pathology. This retracted into the abdomen in the usual fashion.    At this time a left-sided Progrip mesh was then anchored to the pubic tubercle with a 2-0 Prolene.  It was anchored to the shelving edge of the external oblique x 1 and the conjoint tendon cephalad x 1.  The wrap around of the mesh was sutured to the conjoint tendon as well.  The new internal ring did not strangulate the spermatic cord.   The tail was then tucked under the external oblique. At this time the area was irrigated out with sterile saline.    The external oblique  was reapproximated using a 2-0 Vicryl in a running fashion. Scarpa's fascia was then reapproximated using a 3-0 Vicryl running fashion. The skin was then reapproximated with 4 Monocryl in a subcuticular fashion. The skin was then dressed with Dermabond.  The patient was taken to the recovery room in stable condition.        PLAN OF CARE: Discharge to home after PACU  PATIENT DISPOSITION:  PACU - hemodynamically stable.   Delay start of Pharmacological VTE agent (>24hrs)  due to surgical blood loss or risk of bleeding: not applicable

## 2017-09-16 ENCOUNTER — Encounter (HOSPITAL_COMMUNITY): Payer: Self-pay | Admitting: General Surgery

## 2017-09-16 DIAGNOSIS — K4030 Unilateral inguinal hernia, with obstruction, without gangrene, not specified as recurrent: Secondary | ICD-10-CM | POA: Diagnosis not present

## 2017-09-16 NOTE — Discharge Summary (Signed)
Physician Discharge Summary  Patient ID: Bryan Bartlett MRN: [id number] DOB/AGE: [birth year] 61 y.o.  Admit date: 09/15/2017 Discharge date: 09/16/2017  Admission Diagnoses:s/p hernia repair  Discharge Diagnoses:  Active Problems:   Inguinal hernia   Discharged Condition: good  Hospital Course: 61 y/o m, s/p hernia repair.  Please see op note for full details. Post op pt was unstable likely 2/2 to anesthesia and with a h/o CVA and min support at home, he was admitted for obs.  He did well and had min pain.  He was tol PO He was deemed stable on POD 1 for DC and Dc'd home.  Consults: noen  Significant Diagnostic Studies: none  Treatments: surgery: as above  Discharge Exam: Blood pressure (!) 102/50, pulse 68, temperature (!) 97.3 F (36.3 C), temperature source Oral, resp. rate 18, height 5\' 8"  (1.727 m), weight 86.2 kg (190 lb), SpO2 96 %. General appearance: alert and cooperative GI: soft, non-tender; bowel sounds normal; no masses,  no organomegaly and incision c/d/i  Disposition: 03-Skilled Nursing Facility  Discharge Instructions    Diet - low sodium heart healthy    Complete by:  As directed    Increase activity slowly    Complete by:  As directed      Allergies as of 09/16/2017   No Known Allergies     Medication List    STOP taking these medications   docusate sodium 100 MG capsule Commonly known as:  STOOL SOFTENER   ondansetron 4 MG tablet Commonly known as:  ZOFRAN   saccharomyces boulardii 250 MG capsule Commonly known as:  FLORASTOR   tamsulosin 0.4 MG Caps capsule Commonly known as:  FLOMAX     TAKE these medications   ALPRAZolam 0.5 MG tablet Commonly known as:  XANAX Take 0.25 mg by mouth daily as needed for anxiety.   baclofen 10 MG tablet Commonly known as:  LIORESAL Take 10 mg by mouth at bedtime.   citalopram 20 MG tablet Commonly known as:  CELEXA Take 20 mg by mouth daily.   furosemide 20 MG tablet Commonly known as:   LASIX Take 1 tablet (20 mg total) by mouth daily as needed for fluid or edema. What changed:  when to take this   labetalol 200 MG tablet Commonly known as:  NORMODYNE Take 400 mg by mouth 2 (two) times daily.   lisinopril 40 MG tablet Commonly known as:  PRINIVIL,ZESTRIL Take 40 mg by mouth 2 (two) times daily.   methylphenidate 10 MG tablet Commonly known as:  RITALIN Take 10 mg by mouth 2 (two) times daily.   minoxidil 10 MG tablet Commonly known as:  LONITEN Take 10 mg by mouth 2 (two) times daily.   MULTIVITAMIN ADULT PO Take 1 tablet by mouth daily. Duterra Multi dose pack   oxyCODONE-acetaminophen 5-325 MG tablet Commonly known as:  ROXICET Take 1 tablet by mouth every 6 (six) hours as needed.   UNABLE TO FIND Take 1 tablet by mouth daily. Triease allergy medication by Meyer Russel      Follow-up Information    Ralene Ok, MD. Schedule an appointment as soon as possible for a visit in 2 weeks.   Specialty:  General Surgery Why:  For wound re-check Contact information: [place] [address] [phone]           Signed: Rosario Jacks., Anne Hahn 09/16/2017, 7:45 AM

## 2018-11-24 ENCOUNTER — Other Ambulatory Visit: Payer: Self-pay | Admitting: General Surgery

## 2018-11-25 ENCOUNTER — Other Ambulatory Visit: Payer: Self-pay | Admitting: General Practice

## 2018-11-25 ENCOUNTER — Other Ambulatory Visit: Payer: Self-pay | Admitting: General Surgery

## 2018-11-25 DIAGNOSIS — K4090 Unilateral inguinal hernia, without obstruction or gangrene, not specified as recurrent: Secondary | ICD-10-CM

## 2018-11-25 DIAGNOSIS — N433 Hydrocele, unspecified: Secondary | ICD-10-CM

## 2018-11-29 ENCOUNTER — Other Ambulatory Visit: Payer: Self-pay | Admitting: General Surgery

## 2018-11-29 DIAGNOSIS — K4090 Unilateral inguinal hernia, without obstruction or gangrene, not specified as recurrent: Secondary | ICD-10-CM

## 2018-11-29 DIAGNOSIS — N433 Hydrocele, unspecified: Secondary | ICD-10-CM

## 2018-12-01 ENCOUNTER — Ambulatory Visit
Admission: RE | Admit: 2018-12-01 | Discharge: 2018-12-01 | Disposition: A | Discharge status: Home / Self Care | Payer: Medicare Other | Source: Ambulatory Visit | Attending: General Surgery | Admitting: General Surgery

## 2018-12-01 DIAGNOSIS — N433 Hydrocele, unspecified: Secondary | ICD-10-CM

## 2018-12-01 DIAGNOSIS — K4090 Unilateral inguinal hernia, without obstruction or gangrene, not specified as recurrent: Secondary | ICD-10-CM

## 2019-01-27 ENCOUNTER — Other Ambulatory Visit: Payer: Self-pay | Admitting: Urology

## 2019-01-28 ENCOUNTER — Other Ambulatory Visit (HOSPITAL_COMMUNITY): Payer: Self-pay | Admitting: Urology

## 2019-01-28 ENCOUNTER — Other Ambulatory Visit: Payer: Self-pay | Admitting: Urology

## 2019-01-28 DIAGNOSIS — C61 Malignant neoplasm of prostate: Secondary | ICD-10-CM

## 2019-01-31 ENCOUNTER — Other Ambulatory Visit: Payer: Self-pay | Admitting: Urology

## 2019-02-22 ENCOUNTER — Inpatient Hospital Stay (HOSPITAL_COMMUNITY): Admission: RE | Admit: 2019-02-22 | Payer: Medicare Other | Source: Ambulatory Visit

## 2019-03-03 ENCOUNTER — Encounter (HOSPITAL_COMMUNITY): Admission: RE | Payer: Self-pay | Source: Home / Self Care

## 2019-03-03 ENCOUNTER — Ambulatory Visit (HOSPITAL_COMMUNITY): Admission: RE | Admit: 2019-03-03 | Payer: Medicare Other | Source: Home / Self Care | Admitting: Urology

## 2019-03-03 ENCOUNTER — Ambulatory Visit (HOSPITAL_COMMUNITY): Payer: Medicare Other

## 2019-03-03 SURGERY — HYDROCELECTOMY
Anesthesia: General | Laterality: Right

## 2019-03-23 ENCOUNTER — Other Ambulatory Visit: Payer: Self-pay | Admitting: Urology

## 2019-03-24 ENCOUNTER — Other Ambulatory Visit: Payer: Self-pay | Admitting: Urology

## 2019-03-24 NOTE — Progress Notes (Signed)
Spoke with patient sister Nevin Bloodgood , she states he is patients HCPOA and she is going to cancel his upcoming surgery. She states her reason for cancelling is beasue her brother (patient) has severe cognitive deficits as result of past stroke and she is concerned because she cannot accompany day of surgery d/t covid 19 visitor restrictions. RN instructed her to contact [facility] at [facility] to cancel. RN then spoke with Pam at [facility] to make aware .

## 2019-03-30 ENCOUNTER — Encounter (HOSPITAL_BASED_OUTPATIENT_CLINIC_OR_DEPARTMENT_OTHER): Admission: RE | Payer: Self-pay | Source: Home / Self Care

## 2019-03-30 ENCOUNTER — Ambulatory Visit (HOSPITAL_BASED_OUTPATIENT_CLINIC_OR_DEPARTMENT_OTHER): Admission: RE | Admit: 2019-03-30 | Payer: Medicare Other | Source: Home / Self Care | Admitting: Urology

## 2019-03-30 ENCOUNTER — Ambulatory Visit (HOSPITAL_COMMUNITY): Payer: Medicare Other | Attending: Urology

## 2019-03-30 SURGERY — HYDROCELECTOMY
Anesthesia: General | Laterality: Right

## 2019-09-13 IMAGING — US US SCROTUM W/ DOPPLER COMPLETE
1 series · 14 of 25 positions shown · non-contrast
Comparison: Ultrasound 07/27/2017.  Pelvic MRI 03/26/2017.

CLINICAL DATA: Hydrocele. Enlarged scrotum. History of inguinal
hernia repair on the left.

EXAM:
SCROTAL ULTRASOUND
DOPPLER ULTRASOUND OF THE TESTICLES
TECHNIQUE: Complete ultrasound examination of the testicles, epididymis, and
other scrotal structures was performed. Color and spectral Doppler
ultrasound were also utilized to evaluate blood flow to the
testicles.

[Series 1: us scrotum w/ doppler complete · 0.14mm/px · 14 of 42 slices shown]
[im 1/42]
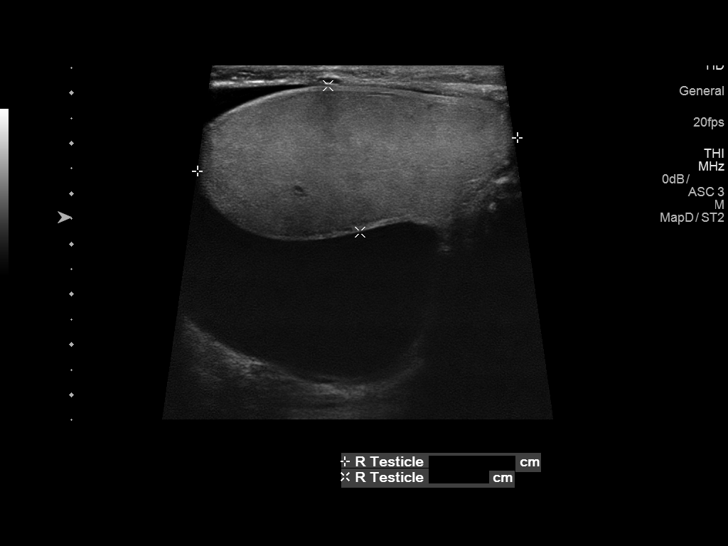
[im 4/42]
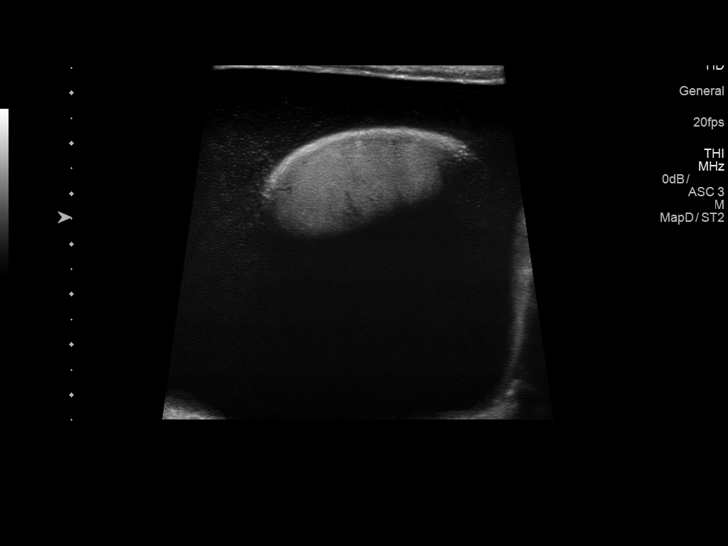
[im 7/42]
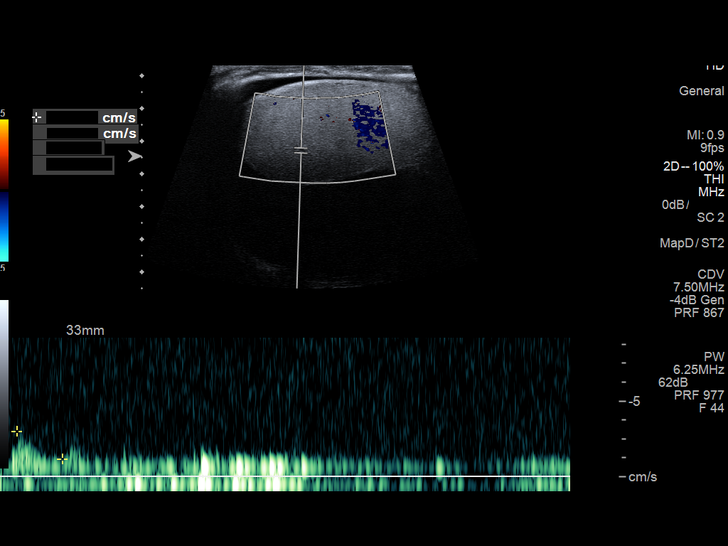
[im 11/42]
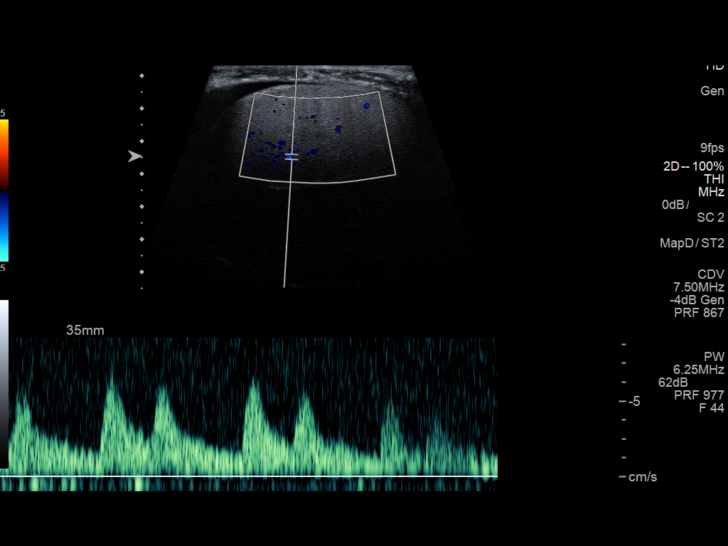
[im 14/42]
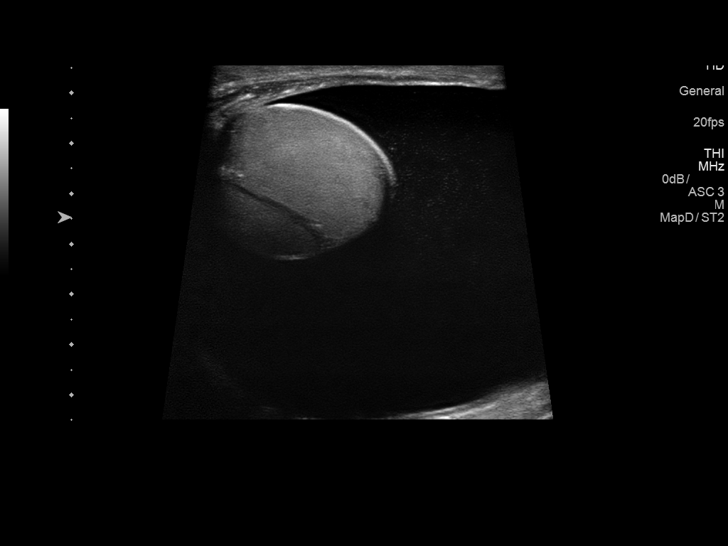
[im 16/42]
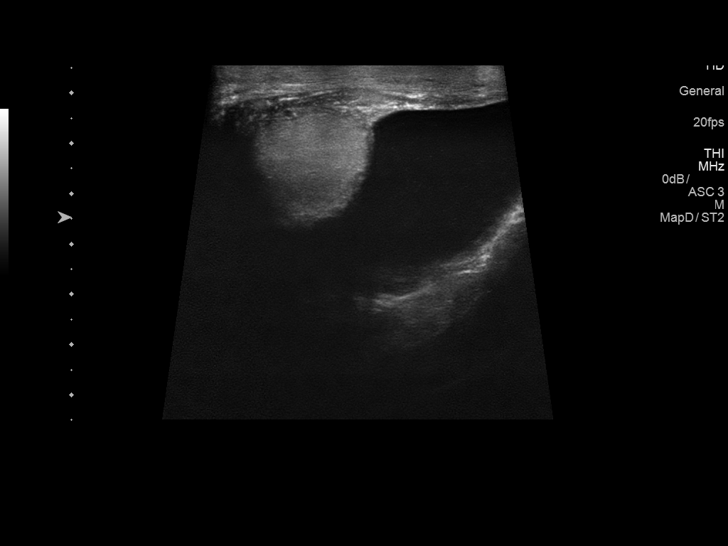
[im 19/42]
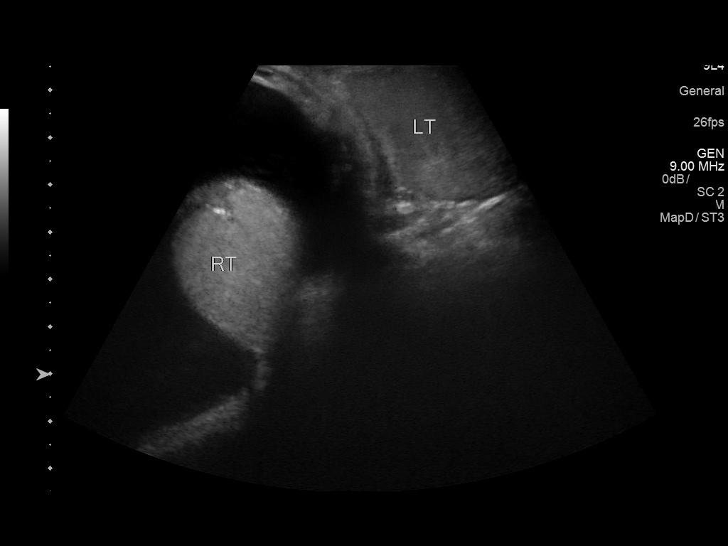
[im 23/42]
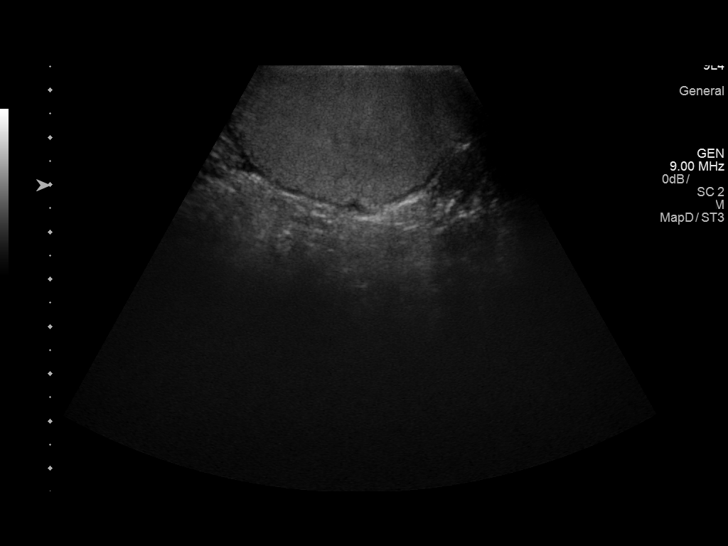
[im 26/42]
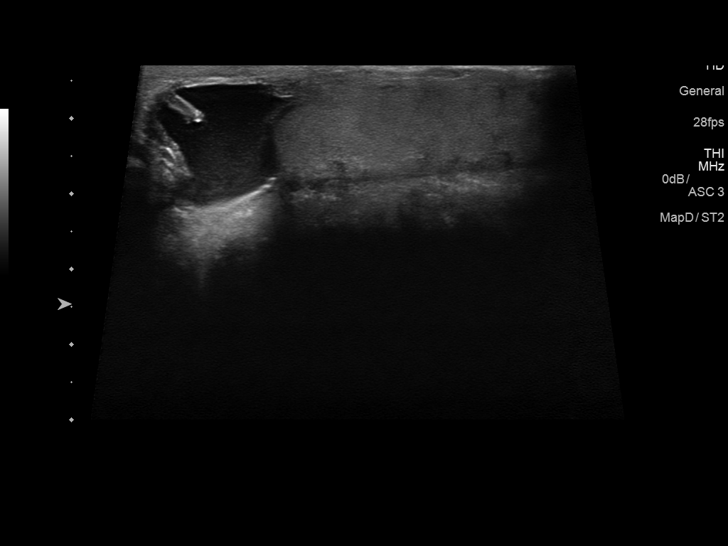
[im 28/42]
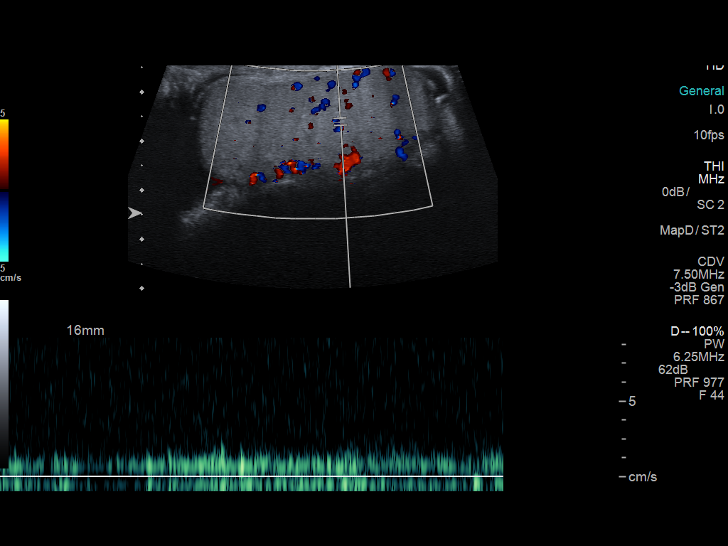
[im 31/42]
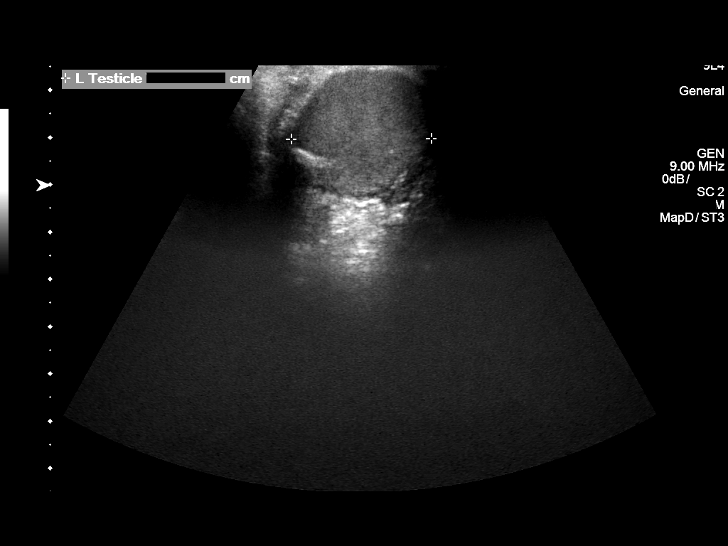
[im 35/42]
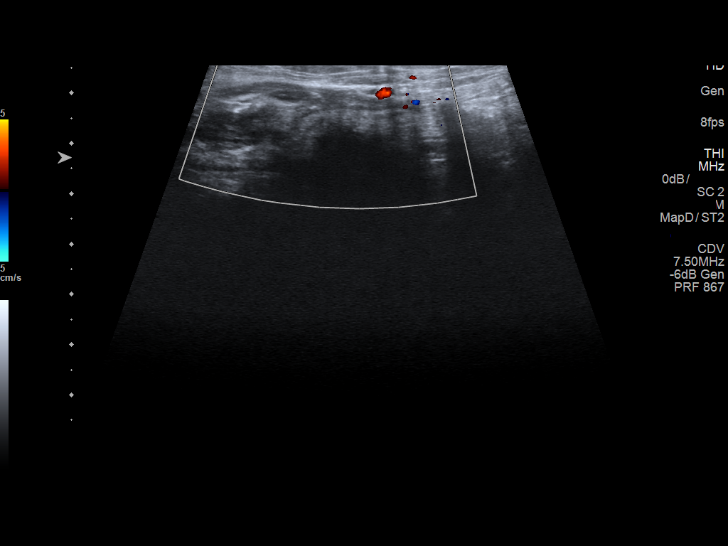
[im 38/42]
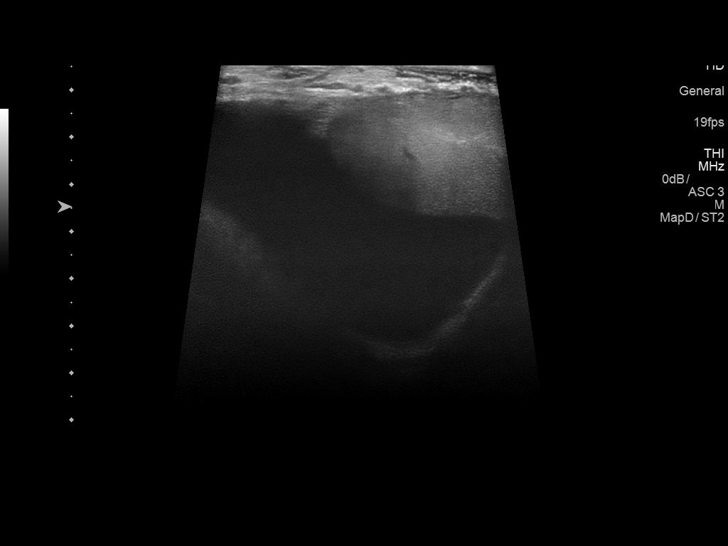
[im 42/42]
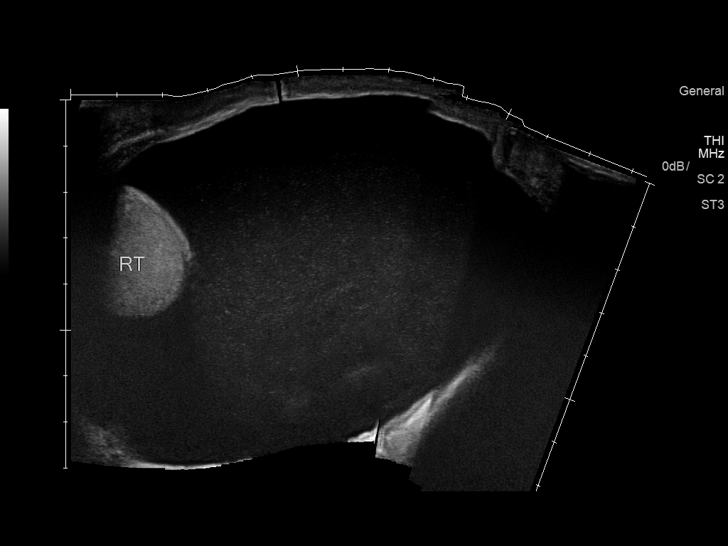

[14 of 25 positions shown; findings below may reference images not displayed]

FINDINGS: Right testicle

Measurements: 6.4 x 3.0 x 3.1 cm. No mass or microlithiasis
visualized.

Left testicle

Measurements: 5.3 x 3.0 x 3.0 cm. No mass or microlithiasis
visualized.

Right epididymis:  Not visualized

Left epididymis:  Nonvisualized

Hydrocele:  Very large right hydrocele.

Varicocele:  None visualized.

Pulsed Doppler interrogation of both testes demonstrates normal low
resistance arterial and venous waveforms bilaterally.
IMPRESSION: Very large right hydrocele. No evidence of testicular mass or
torsion.

## 2020-03-22 ENCOUNTER — Other Ambulatory Visit: Payer: Self-pay | Admitting: Urology

## 2020-03-22 ENCOUNTER — Other Ambulatory Visit (HOSPITAL_COMMUNITY): Payer: Self-pay | Admitting: Urology

## 2020-03-22 DIAGNOSIS — C61 Malignant neoplasm of prostate: Secondary | ICD-10-CM

## 2020-03-28 ENCOUNTER — Encounter (HOSPITAL_BASED_OUTPATIENT_CLINIC_OR_DEPARTMENT_OTHER): Payer: Self-pay | Admitting: Urology

## 2020-03-30 ENCOUNTER — Encounter (HOSPITAL_BASED_OUTPATIENT_CLINIC_OR_DEPARTMENT_OTHER): Payer: Self-pay | Admitting: Urology

## 2020-03-30 ENCOUNTER — Other Ambulatory Visit: Payer: Self-pay

## 2020-03-30 NOTE — Progress Notes (Addendum)
ADDENDUM:  Chart reviewed by anesthesia, Bryan Felix PA w/ received pcp lov (Dr Alyson Ingles) via fax which is with chart.  Ok to proceed.  Spoke w/ via phone for pre-op interview--- Pt's sister, Bryan Bartlett,  Pt has memory issues Lab needs dos---- Istat and EKG              Lab results------ no COVID test ------ 03-31-2020 @ 1130 Arrive at ------- 0830 NPO after ------ [place] Medications to take morning of surgery ----- Sister states pt can take any medication on empty stomach, causes nausea/ vomiting Diabetic medication ----- n/a  Patient Special Instructions ----- asked sister to bring labetalol in original prescription bottle dos, anesthesia will need to advice if pt needs to take anyway since this is a beta blocker  Pre-Op special Istructions ----- pt has left side hemiparesis. Pt walks with walker at home but wheelchair when he goes out.  Pt will be need wheelchair dos.  Pt can stand and pivot with minimal assist to stretcher.    Patient verbalized understanding of instructions that were given at this phone interview. Patient denies shortness of breath, chest pain, fever, cough a this phone interview.   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  Hx HTN, Prostate cancer, DVT left lower extremity 2008 with stroke.  ?IVC filter, because sister stated that something was put in thru pt goin to keep clot from doing thru body and is still remains.  Chart to be reviewed by Bryan Felix PA.  PCP: Dr Maury Dus Cardiologist : no Chest x-ray : 06-28-2016 epic EKG : 08-31-2017 epic Echo : no Cardiac Cath :  no Sleep Study/ CPAP : NO Fasting Blood Sugar :      / Checks Blood Sugar -- times a day:   N/a Blood Thinner/ Instructions /Last Dose: NO ASA / Instructions/ Last Dose :  NO

## 2020-03-31 ENCOUNTER — Other Ambulatory Visit (HOSPITAL_COMMUNITY)
Admission: RE | Admit: 2020-03-31 | Discharge: 2020-03-31 | Disposition: A | Discharge status: Home / Self Care | Payer: Medicare Other | Source: Ambulatory Visit | Attending: Urology | Admitting: Urology

## 2020-03-31 DIAGNOSIS — Z01812 Encounter for preprocedural laboratory examination: Secondary | ICD-10-CM | POA: Insufficient documentation

## 2020-03-31 DIAGNOSIS — Z20822 Contact with and (suspected) exposure to covid-19: Secondary | ICD-10-CM | POA: Insufficient documentation

## 2020-03-31 LAB — SARS CORONAVIRUS 2 (TAT 6-24 HRS): SARS Coronavirus 2: NEGATIVE

## 2020-04-03 NOTE — Anesthesia Preprocedure Evaluation (Addendum)
Anesthesia Evaluation  Patient identified by MRN, date of birth, ID band Patient awake    Reviewed: Allergy & Precautions, NPO status , Patient's Chart, lab work & pertinent test results  Airway Mallampati: II  TM Distance: >3 FB Neck ROM: Full    Dental no notable dental hx. (+) Teeth Intact, Dental Advisory Given   Pulmonary COPD,    Pulmonary exam normal breath sounds clear to auscultation       Cardiovascular hypertension, Pt. on medications and Pt. on home beta blockers + DVT  Normal cardiovascular exam Rhythm:Regular Rate:Normal  EKG SR R 77 w PAc   Neuro/Psych PSYCHIATRIC DISORDERS Anxiety L hemi paresis Memory loss CVA, Residual Symptoms    GI/Hepatic Neg liver ROS, GERD  ,  Endo/Other  negative endocrine ROS  Renal/GU Renal disease     Musculoskeletal  (+) Arthritis ,   Abdominal   Peds  Hematology negative hematology ROS (+)   Anesthesia Other Findings   Reproductive/Obstetrics                            Anesthesia Physical Anesthesia Plan  ASA: III  Anesthesia Plan: General   Post-op Pain Management:    Induction: Intravenous  PONV Risk Score and Plan: Treatment may vary due to age or medical condition, Ondansetron and Dexamethasone  Airway Management Planned: LMA  Additional Equipment: None  Intra-op Plan:   Post-operative Plan:   Informed Consent: I have reviewed the patients History and Physical, chart, labs and discussed the procedure including the risks, benefits and alternatives for the proposed anesthesia with the patient or authorized representative who has indicated his/her understanding and acceptance.     Dental advisory given  Plan Discussed with: CRNA  Anesthesia Plan Comments:        Anesthesia Quick Evaluation

## 2020-04-04 ENCOUNTER — Ambulatory Visit (HOSPITAL_BASED_OUTPATIENT_CLINIC_OR_DEPARTMENT_OTHER)
Admission: RE | Admit: 2020-04-04 | Discharge: 2020-04-04 | Disposition: A | Discharge status: Home / Self Care | Payer: Medicare Other | Attending: Urology | Admitting: Urology

## 2020-04-04 ENCOUNTER — Ambulatory Visit (HOSPITAL_COMMUNITY)
Admission: RE | Admit: 2020-04-04 | Discharge: 2020-04-04 | Disposition: A | Discharge status: Home / Self Care | Payer: Medicare Other | Source: Ambulatory Visit | Attending: Urology | Admitting: Urology

## 2020-04-04 ENCOUNTER — Encounter (HOSPITAL_BASED_OUTPATIENT_CLINIC_OR_DEPARTMENT_OTHER): Payer: Self-pay | Admitting: Urology

## 2020-04-04 ENCOUNTER — Ambulatory Visit (HOSPITAL_BASED_OUTPATIENT_CLINIC_OR_DEPARTMENT_OTHER): Payer: Medicare Other | Admitting: Physician Assistant

## 2020-04-04 ENCOUNTER — Encounter (HOSPITAL_BASED_OUTPATIENT_CLINIC_OR_DEPARTMENT_OTHER)
Admission: RE | Disposition: A | Discharge status: Home / Self Care | Payer: Self-pay | Source: Home / Self Care | Attending: Urology

## 2020-04-04 ENCOUNTER — Other Ambulatory Visit: Payer: Self-pay

## 2020-04-04 DIAGNOSIS — I10 Essential (primary) hypertension: Secondary | ICD-10-CM | POA: Insufficient documentation

## 2020-04-04 DIAGNOSIS — N433 Hydrocele, unspecified: Secondary | ICD-10-CM | POA: Insufficient documentation

## 2020-04-04 DIAGNOSIS — C61 Malignant neoplasm of prostate: Secondary | ICD-10-CM | POA: Diagnosis present

## 2020-04-04 DIAGNOSIS — Z79899 Other long term (current) drug therapy: Secondary | ICD-10-CM | POA: Diagnosis not present

## 2020-04-04 DIAGNOSIS — J449 Chronic obstructive pulmonary disease, unspecified: Secondary | ICD-10-CM | POA: Diagnosis not present

## 2020-04-04 DIAGNOSIS — Z86718 Personal history of other venous thrombosis and embolism: Secondary | ICD-10-CM | POA: Insufficient documentation

## 2020-04-04 DIAGNOSIS — I69354 Hemiplegia and hemiparesis following cerebral infarction affecting left non-dominant side: Secondary | ICD-10-CM | POA: Insufficient documentation

## 2020-04-04 DIAGNOSIS — Z791 Long term (current) use of non-steroidal anti-inflammatories (NSAID): Secondary | ICD-10-CM | POA: Diagnosis not present

## 2020-04-04 DIAGNOSIS — F419 Anxiety disorder, unspecified: Secondary | ICD-10-CM | POA: Insufficient documentation

## 2020-04-04 HISTORY — DX: Other complications of anesthesia, initial encounter: T88.59XA

## 2020-04-04 HISTORY — DX: Unspecified sequelae of cerebral infarction: I69.30

## 2020-04-04 HISTORY — PX: HYDROCELE EXCISION: SHX482

## 2020-04-04 HISTORY — DX: Personal history of other diseases of the digestive system: Z87.19

## 2020-04-04 HISTORY — DX: Hydrocele, unspecified: N43.3

## 2020-04-04 HISTORY — PX: PROSTATE BIOPSY: SHX241

## 2020-04-04 HISTORY — DX: Malignant neoplasm of prostate: C61

## 2020-04-04 HISTORY — DX: Other seasonal allergic rhinitis: J30.2

## 2020-04-04 HISTORY — DX: Unspecified abnormalities of gait and mobility: I69.398

## 2020-04-04 LAB — POCT I-STAT, CHEM 8
BUN: 23 mg/dL (ref 8–23)
Calcium, Ion: 1.26 mmol/L (ref 1.15–1.40)
Chloride: 102 mmol/L (ref 98–111)
Creatinine, Ser: 1.4 mg/dL — ABNORMAL HIGH (ref 0.61–1.24)
Glucose, Bld: 97 mg/dL (ref 70–99)
HCT: 38 % — ABNORMAL LOW (ref 39.0–52.0)
Hemoglobin: 12.9 g/dL — ABNORMAL LOW (ref 13.0–17.0)
Potassium: 4.1 mmol/L (ref 3.5–5.1)
Sodium: 140 mmol/L (ref 135–145)
TCO2: 28 mmol/L (ref 22–32)

## 2020-04-04 SURGERY — BIOPSY, PROSTATE, RECTAL APPROACH, WITH US GUIDANCE
Anesthesia: General | Site: Scrotum

## 2020-04-04 MED ORDER — EPHEDRINE SULFATE-NACL 50-0.9 MG/10ML-% IV SOSY
PREFILLED_SYRINGE | INTRAVENOUS | Status: DC | PRN
  Administered 2020-04-04 (×5): 10 mg via INTRAVENOUS

## 2020-04-04 MED ORDER — PROPOFOL 10 MG/ML IV BOLUS
INTRAVENOUS | Status: DC | PRN
  Administered 2020-04-04: 150 mg via INTRAVENOUS

## 2020-04-04 MED ORDER — BUPIVACAINE HCL (PF) 0.25 % IJ SOLN
INTRAMUSCULAR | Status: DC | PRN
  Administered 2020-04-04: 20 mL

## 2020-04-04 MED ORDER — FENTANYL CITRATE (PF) 100 MCG/2ML IJ SOLN: 25.0000 ug | INTRAMUSCULAR | Status: DC | PRN

## 2020-04-04 MED ORDER — PHENYLEPHRINE HCL (PRESSORS) 10 MG/ML IV SOLN
INTRAVENOUS | Status: AC
  Filled 2020-04-04: qty 2

## 2020-04-04 MED ORDER — LIDOCAINE 2% (20 MG/ML) 5 ML SYRINGE
INTRAMUSCULAR | Status: DC | PRN
  Administered 2020-04-04: 80 mg via INTRAVENOUS

## 2020-04-04 MED ORDER — PHENYLEPHRINE HCL-NACL 20-0.9 MG/250ML-% IV SOLN
INTRAVENOUS | Status: DC | PRN
  Administered 2020-04-04: 40 ug/min via INTRAVENOUS

## 2020-04-04 MED ORDER — SODIUM CHLORIDE 0.9 % IV SOLN
INTRAVENOUS | Status: AC
  Filled 2020-04-04: qty 100

## 2020-04-04 MED ORDER — CEFTRIAXONE SODIUM 2 G IJ SOLR
INTRAMUSCULAR | Status: AC
  Filled 2020-04-04: qty 20

## 2020-04-04 MED ORDER — FLEET ENEMA 7-19 GM/118ML RE ENEM: 1.0000 | ENEMA | Freq: Once | RECTAL | Status: DC

## 2020-04-04 MED ORDER — OXYCODONE HCL 5 MG/5ML PO SOLN: 5.0000 mg | Freq: Once | ORAL | Status: DC | PRN

## 2020-04-04 MED ORDER — FENTANYL CITRATE (PF) 100 MCG/2ML IJ SOLN
INTRAMUSCULAR | Status: DC | PRN
  Administered 2020-04-04: 50 ug via INTRAVENOUS
  Administered 2020-04-04 (×2): 25 ug via INTRAVENOUS

## 2020-04-04 MED ORDER — LACTATED RINGERS IV SOLN: INTRAVENOUS | Status: DC

## 2020-04-04 MED ORDER — MIDAZOLAM HCL 2 MG/2ML IJ SOLN
INTRAMUSCULAR | Status: AC
  Filled 2020-04-04: qty 2

## 2020-04-04 MED ORDER — DEXAMETHASONE SODIUM PHOSPHATE 10 MG/ML IJ SOLN
INTRAMUSCULAR | Status: AC
  Filled 2020-04-04: qty 1

## 2020-04-04 MED ORDER — SODIUM CHLORIDE 0.9 % IV SOLN
2.0000 g | INTRAVENOUS | Status: AC
  Administered 2020-04-04: 2 g via INTRAVENOUS

## 2020-04-04 MED ORDER — ONDANSETRON HCL 4 MG/2ML IJ SOLN
INTRAMUSCULAR | Status: DC | PRN
  Administered 2020-04-04: 4 mg via INTRAVENOUS

## 2020-04-04 MED ORDER — PROPOFOL 10 MG/ML IV BOLUS
INTRAVENOUS | Status: AC
  Filled 2020-04-04: qty 40

## 2020-04-04 MED ORDER — MEPERIDINE HCL 25 MG/ML IJ SOLN: 6.2500 mg | INTRAMUSCULAR | Status: DC | PRN

## 2020-04-04 MED ORDER — ONDANSETRON HCL 4 MG/2ML IJ SOLN
INTRAMUSCULAR | Status: AC
  Filled 2020-04-04: qty 2

## 2020-04-04 MED ORDER — FENTANYL CITRATE (PF) 100 MCG/2ML IJ SOLN
INTRAMUSCULAR | Status: AC
  Filled 2020-04-04: qty 2

## 2020-04-04 MED ORDER — TRAMADOL HCL 50 MG PO TABS: 50.0000 mg | ORAL_TABLET | Freq: Four times a day (QID) | ORAL | 0 refills | Status: AC | PRN

## 2020-04-04 MED ORDER — SODIUM CHLORIDE 0.9 % IR SOLN
Status: DC | PRN
  Administered 2020-04-04: 500 mL

## 2020-04-04 MED ORDER — LIDOCAINE 2% (20 MG/ML) 5 ML SYRINGE
INTRAMUSCULAR | Status: AC
  Filled 2020-04-04: qty 5

## 2020-04-04 MED ORDER — OXYCODONE HCL 5 MG PO TABS: 5.0000 mg | ORAL_TABLET | Freq: Once | ORAL | Status: DC | PRN

## 2020-04-04 MED ORDER — EPHEDRINE 5 MG/ML INJ
INTRAVENOUS | Status: AC
  Filled 2020-04-04: qty 10

## 2020-04-04 MED ORDER — MIDAZOLAM HCL 2 MG/2ML IJ SOLN
INTRAMUSCULAR | Status: DC | PRN
  Administered 2020-04-04 (×2): 1 mg via INTRAVENOUS

## 2020-04-04 MED ORDER — ONDANSETRON HCL 4 MG/2ML IJ SOLN: 4.0000 mg | Freq: Once | INTRAMUSCULAR | Status: DC | PRN

## 2020-04-04 MED ORDER — DEXAMETHASONE SODIUM PHOSPHATE 10 MG/ML IJ SOLN
INTRAMUSCULAR | Status: DC | PRN
  Administered 2020-04-04: 4 mg via INTRAVENOUS

## 2020-04-04 MED ORDER — PHENYLEPHRINE 40 MCG/ML (10ML) SYRINGE FOR IV PUSH (FOR BLOOD PRESSURE SUPPORT)
PREFILLED_SYRINGE | INTRAVENOUS | Status: AC
  Filled 2020-04-04: qty 10

## 2020-04-04 MED ORDER — PHENYLEPHRINE 40 MCG/ML (10ML) SYRINGE FOR IV PUSH (FOR BLOOD PRESSURE SUPPORT)
PREFILLED_SYRINGE | INTRAVENOUS | Status: DC | PRN
  Administered 2020-04-04 (×2): 80 ug via INTRAVENOUS

## 2020-04-04 MED ORDER — LIDOCAINE HCL 2 % IJ SOLN
INTRAMUSCULAR | Status: DC | PRN
  Administered 2020-04-04: 10 mL

## 2020-04-04 SURGICAL SUPPLY — 62 items
BLADE CLIPPER SENSICLIP SURGIC (BLADE) ×2 IMPLANT
BLADE SURG 15 STRL LF DISP TIS (BLADE) ×2 IMPLANT
BLADE SURG 15 STRL SS (BLADE) ×2
BNDG GAUZE ELAST 4 BULKY (GAUZE/BANDAGES/DRESSINGS) ×4 IMPLANT
BRIEF STRETCH FOR OB PAD LRG (UNDERPADS AND DIAPERS) ×4 IMPLANT
CANISTER SUCT 3000ML PPV (MISCELLANEOUS) IMPLANT
CLEANER CAUTERY TIP 5X5 PAD (MISCELLANEOUS) ×2 IMPLANT
COVER BACK TABLE 60X90IN (DRAPES) ×4 IMPLANT
COVER MAYO STAND STRL (DRAPES) ×4 IMPLANT
COVER WAND RF STERILE (DRAPES) ×4 IMPLANT
DERMABOND ADVANCED (GAUZE/BANDAGES/DRESSINGS) ×2
DERMABOND ADVANCED .7 DNX12 (GAUZE/BANDAGES/DRESSINGS) ×2 IMPLANT
DISSECTOR ROUND CHERRY 3/8 STR (MISCELLANEOUS) IMPLANT
DRAIN PENROSE 0.25X18 (DRAIN) IMPLANT
DRAPE LAPAROTOMY 100X72 PEDS (DRAPES) ×4 IMPLANT
ELECT NDL BLADE 2-5/6 (NEEDLE) ×2 IMPLANT
ELECT NEEDLE BLADE 2-5/6 (NEEDLE) ×4 IMPLANT
ELECT REM PT RETURN 9FT ADLT (ELECTROSURGICAL) ×4
ELECTRODE REM PT RTRN 9FT ADLT (ELECTROSURGICAL) ×2 IMPLANT
GAUZE SPONGE 4X4 12PLY STRL (GAUZE/BANDAGES/DRESSINGS) ×2 IMPLANT
GLOVE BIO SURGEON STRL SZ 6.5 (GLOVE) ×1 IMPLANT
GLOVE BIO SURGEON STRL SZ7.5 (GLOVE) ×4 IMPLANT
GLOVE BIO SURGEONS STRL SZ 6.5 (GLOVE) ×1
GLOVE BIOGEL PI IND STRL 6.5 (GLOVE) IMPLANT
GLOVE BIOGEL PI IND STRL 7.0 (GLOVE) IMPLANT
GLOVE BIOGEL PI INDICATOR 6.5 (GLOVE) ×2
GLOVE BIOGEL PI INDICATOR 7.0 (GLOVE) ×4
GOWN STRL REUS W/ TWL LRG LVL3 (GOWN DISPOSABLE) ×2 IMPLANT
GOWN STRL REUS W/ TWL XL LVL3 (GOWN DISPOSABLE) ×2 IMPLANT
GOWN STRL REUS W/TWL LRG LVL3 (GOWN DISPOSABLE) ×4 IMPLANT
GOWN STRL REUS W/TWL XL LVL3 (GOWN DISPOSABLE) ×2
INST BIOPSY MAXCORE 18GX25 (NEEDLE) ×2 IMPLANT
INSTR BIOPSY MAXCORE 18GX20 (NEEDLE) IMPLANT
KIT TURNOVER CYSTO (KITS) ×4 IMPLANT
NDL SAFETY ECLIPSE 18X1.5 (NEEDLE) IMPLANT
NDL SPNL 22GX7 QUINCKE BK (NEEDLE) IMPLANT
NEEDLE HYPO 18GX1.5 SHARP (NEEDLE)
NEEDLE HYPO 22GX1.5 SAFETY (NEEDLE) ×2 IMPLANT
NEEDLE SPNL 22GX7 QUINCKE BK (NEEDLE) ×4 IMPLANT
NS IRRIG 500ML POUR BTL (IV SOLUTION) ×4 IMPLANT
PAD CLEANER CAUTERY TIP 5X5 (MISCELLANEOUS) ×2
PENCIL BUTTON HOLSTER BLD 10FT (ELECTRODE) ×2 IMPLANT
PENCIL SMOKE EVACUATOR (MISCELLANEOUS) ×2 IMPLANT
SET BASIN DAY SURGERY F.S. (CUSTOM PROCEDURE TRAY) ×4 IMPLANT
SURGILUBE 2OZ TUBE FLIPTOP (MISCELLANEOUS) ×4 IMPLANT
SUT ETHILON 3 0 PS 1 (SUTURE) ×4 IMPLANT
SUT MNCRL AB 4-0 PS2 18 (SUTURE) ×2 IMPLANT
SUT VIC AB 3-0 SH 27 (SUTURE) ×4
SUT VIC AB 3-0 SH 27X BRD (SUTURE) ×4 IMPLANT
SUT VIC AB 4-0 RB1 27 (SUTURE)
SUT VIC AB 4-0 RB1 27X BRD (SUTURE) IMPLANT
SUT VICRYL 3 0 BR 18  UND (SUTURE)
SUT VICRYL 3 0 BR 18 UND (SUTURE) ×2 IMPLANT
SYR BULB IRRIG 60ML STRL (SYRINGE) ×4 IMPLANT
SYR CONTROL 10ML LL (SYRINGE) ×4 IMPLANT
TOWEL OR 17X26 10 PK STRL BLUE (TOWEL DISPOSABLE) ×6 IMPLANT
TRAY DSU PREP LF (CUSTOM PROCEDURE TRAY) ×4 IMPLANT
TUBE CONNECTING 12'X1/4 (SUCTIONS) ×1
TUBE CONNECTING 12X1/4 (SUCTIONS) ×3 IMPLANT
UNDERPAD 30X36 HEAVY ABSORB (UNDERPADS AND DIAPERS) ×8 IMPLANT
WATER STERILE IRR 500ML POUR (IV SOLUTION) ×4 IMPLANT
YANKAUER SUCT BULB TIP NO VENT (SUCTIONS) ×4 IMPLANT

## 2020-04-04 NOTE — Anesthesia Procedure Notes (Signed)
Procedure Name: LMA Insertion Date/Time: 04/04/2020 10:33 AM Performed by: Suan Halter, CRNA Pre-anesthesia Checklist: Patient identified, Emergency Drugs available, Suction available and Patient being monitored Patient Re-evaluated:Patient Re-evaluated prior to induction Oxygen Delivery Method: Circle system utilized Preoxygenation: Pre-oxygenation with 100% oxygen Induction Type: IV induction Ventilation: Mask ventilation without difficulty LMA: LMA with gastric port inserted LMA Size: 4.0 Number of attempts: 1 Airway Equipment and Method: Bite block Placement Confirmation: positive ETCO2 Tube secured with: Tape Dental Injury: Teeth and Oropharynx as per pre-operative assessment

## 2020-04-04 NOTE — Discharge Instructions (Signed)
For several days the patient:   should increase his fluid intake and limit strenuous activity.  he might have mild discomfort at the base of his penis or in his rectum.  he might have blood in his urine or blood in his bowel movements.  For 2-3 months he might have blood in his ejaculate (semen).  Call the office immedicately:   for blood clots in the urine or bowel movements,   difficulty urinating,   inability to urinate,   urinary retention,   painful or frequent urination,   fever, chills,   nausea, vomiting,  other illness.    The prostate biopsy pathology reports are usually available within 3-5 working days, unless a pathologic second opinion is required, which may take 7-14 days.   [facility] to check on the status of his biopsy if he has not heard from [place] within 7 days.  [facility]:  [phone]  Discharge instructions following scrotal surgery  Call your doctor for:  Fever is greater than 100.5  Severe nausea or vomiting  Increasing pain not controlled by pain medication  Increasing redness or drainage from incisions  The number for questions or concerns is [phone]  Activity level: No lifting greater than 10 pounds (about equal to milk) for the next 2 weeks or until cleared to do so at follow-up appointment.  Otherwise activity as tolerated by comfort level.  Diet: May resume your regular diet as tolerated  Driving: No driving while still taking opiate pain medications (weight at least 6-8 hours after last dose).  No driving if you still sore from surgery as it may limit her ability to react quickly if necessary.   Shower/bath: May shower and get incision wet pad dry immediately following.  Do not scrub vigorously for the next 2-3 weeks.  Do not soak incision (ID soaking in bath or swimming) until told he may do so by Dr Louis Meckel, as this may promote a wound infection.  Wound care: He may cover wounds with sterile  gauze as needed to prevent incisions rubbing on close follow-up in any seepage.  Where tight fitting underpants for at least 2 weeks.  He should apply cold compresses (ice or sac of frozen peas/corn) to your scrotum for at least 48 hours to reduce the swelling.  You should expect that his scrotum will swell up initially and then get smaller over the next 2-4 weeks.  Follow-up appointments: Follow-up appointment is scheduled for Monday to remove drain.   Post Anesthesia Home Care Instructions  Activity: Get plenty of rest for the remainder of the day. A responsible individual must stay with you for 24 hours following the procedure.  For the next 24 hours, DO NOT: -Drive a car -Paediatric nurse -Drink alcoholic beverages -Take any medication unless instructed by your physician -Make any legal decisions or sign important papers.  Meals: Start with liquid foods such as gelatin or soup. Progress to regular foods as tolerated. Avoid greasy, spicy, heavy foods. If nausea and/or vomiting occur, drink only clear liquids until the nausea and/or vomiting subsides. Call your physician if vomiting continues.  Special Instructions/Symptoms: Your throat may feel dry or sore from the anesthesia or the breathing tube placed in your throat during surgery. If this causes discomfort, gargle with warm salt water. The discomfort should disappear within 24 hours.

## 2020-04-04 NOTE — Interval H&P Note (Signed)
History and Physical Interval Note:  04/04/2020 10:32 AM  Bryan Bartlett  has presented today for surgery, with the diagnosis of LEFT HYDROCELE PROSTATE CANCER.  The various methods of treatment have been discussed with the patient and family. After consideration of risks, benefits and other options for treatment, the patient has consented to  Procedure(s): BIOPSY TRANSRECTAL ULTRASONIC PROSTATE (TUBP) (N/A) LEFT HYDROCELECTOMY ADULT (Left) as a surgical intervention.  The patient's history has been reviewed, patient examined, no change in status, stable for surgery.  I have reviewed the patient's chart and labs.  Questions were answered to the patient's satisfaction.     Ardis Hughs

## 2020-04-04 NOTE — Transfer of Care (Signed)
Immediate Anesthesia Transfer of Care Note  Patient: Bryan Bartlett  Procedure(s) Performed: Procedure(s) (LRB): BIOPSY TRANSRECTAL ULTRASONIC PROSTATE (TUBP) (N/A) LEFT HYDROCELECTOMY ADULT (Left)  Patient Location: PACU  Anesthesia Type: General  Level of Consciousness: awake, oriented, sedated and patient cooperative  Airway & Oxygen Therapy: Patient Spontanous Breathing and Patient connected to face mask oxygen  Post-op Assessment: Report given to PACU RN and Post -op Vital signs reviewed and stable  Post vital signs: Reviewed and stable  Complications: No apparent anesthesia complications Last Vitals:  Vitals Value Taken Time  BP 119/74 04/04/20 1245  Temp 36.6 C 04/04/20 1245  Pulse 85 04/04/20 1249  Resp 18 04/04/20 1249  SpO2 100 % 04/04/20 1249  Vitals shown include unvalidated device data.  Last Pain:  Vitals:   04/04/20 1007  TempSrc: Oral  PainSc: 0-No pain      Patients Stated Pain Goal: 5 (04/04/20 1007)

## 2020-04-04 NOTE — Op Note (Signed)
Preoperative diagnosis:  1. Prostate cancer 2. Left hydrocele  Postoperative diagnosis:  1. Same  Procedure: 1. Transrectal ultrasound 2. Transrectal ultrasound-guided 12 core prostate biopsy 3. Left hydrocelectomy  Surgeon: Ardis Hughs, MD  Anesthesia: General  Complications: None  Intraoperative findings:  #1: The patient's prostate measured approximately 40 g.  There was a mild Lee enlarged median lobe.  There was no hypoechoic areas or any significant notable abnormalities on ultrasound. #2: The patient had approximately 825 cc of straw-colored urine in his left hydrocele sac.  His tissues were very thickened and vascular consistent with longstanding inflammation.  EBL: 100 mL  Specimens: 12 core cores from prostate biopsy labeled left and right lateral base, left and right lateral mid, left and right lateral apex, left and right medial base, left and right medial mid, left and right medial apex  Indication: ERSEL KLEEMAN is a 64 y.o. patient with history of prostate cancer on active surveillance who is due for a prostate biopsy as part of his protocol.  He has had slight increase in his PSA.  In addition, the patient had an inguinal hernia repair several years ago and had a subsequent reactive hydrocele that has persistent the grown over the course of the last several years.  At the same time as the prostate biopsy opted to have his hydrocele repaired.  After reviewing the management options for treatment, he elected to proceed with the above surgical procedure(s). We have discussed the potential benefits and risks of the procedure, side effects of the proposed treatment, the likelihood of the patient achieving the goals of the procedure, and any potential problems that might occur during the procedure or recuperation. Informed consent has been obtained.  Description of procedure: The patient was taken to the operating room and general anesthesia was induced.  An LMA  was inserted.  He was kept on the gurney and placed in the left lateral decubitus position.  A timeout was then performed.  A transrectal ultrasound probe was then inserted in the patient's rectum and 10 cc of local lidocaine Marcaine mix was injected into the seminal vesicle prostatic angle bilaterally as well as the apex bilaterally.  Serial images were then obtained of the prostate with no significant or abnormal findings.  The volume of the patient's prostate was measured to be approximately 40 g.  We then proceeded to perform a 12 core prostate biopsy in the standard fashion with the specimens as noted above.  I then removed the probe and we put the patient back in the supine position.  We subsequently moved him over to the operating room table.  He was then reprepped and draped for the spermatocele.  I then made a midline incision through the scrotal median raphae through the skin and into the dartos. Once through several layers the dartos was able to get the right testicle and contents out of the right  hemiscrotum and into the surgical field.   The hydrocele sac was then dissected out removing the overlying layers of the dartos tunica.  The sac was then opened with Metzenbaum scissors and the fluid drained from the sac.  The opening was then continued so that the entire sac was bivalved.  The edges were then removed, leaving a small edge of tissue around the testicle.  The edge of the remaining sac was cauterized.  The edge was then everted, brought together around the posterior aspect of the testicle and closed in a running fashion using 3-0  Vicryl.  The distal aspect was left open to prevent strangulating the cord.  1/4 inch Penrose drain was placed in the most dependent part of the patient scrotum coming out through the skin and the inner portion being left within the right hemiscrotum.  Meticulous hemostasis was then achieved. The right hemiscrotum was then copiously irrigated and a final check  for hemostasis performed. I then closed the dartos with a 3-0 Vicryl in a running/locking stitch. I closed the skin with a 4-0 Monocryl in a vertical mattress running fashion. I then injected 10 cc of quarter percent Marcaine into the incision, and then placed Dermabond over the incision. A fluff dressing and mesh underpants were then applied area.  The patient tolerated the procedure without any perioperative complications. At the end of the case all last needles and sponges had been accounted for. The patient was returned to the PACU in excellent condition.   Ardis Hughs, M.D.

## 2020-04-04 NOTE — H&P (Signed)
f/u for Prostate Cancer  HPI: Bryan Bartlett is a 64 year-old male established patient who is here for f/u while on Active Surveillance for Prostate Cancer .  The patient was last seen 10/20.   He was diagnosed with prostate cancer in 05/04/2012. At the time of his prostate cancer diagnosis his PSA was 3.69. The patient's Gleason score at the time of diagnosis was 3+3=6. There were 1 positive cores on his initial biospy. He has had 2 biopsy(ies). The patient's most recent biopsy was 01/04/2013.   The patient has had a prostate MRI. His prostate MRI was in 07/04/2017. This showed: PI-RADs 1 lesion.   His most recent PSA is 6.06. This was drawn on approximately 03/13/2020. PSA History: 1/20: 6.07, 8/19: 8.95, 06/2018 3/19: 5.62, 4/18: 9.8, 7/17: 2.91.   He does not have urinary incontinence. He does not have an abnormal sensation when he needs to urinate. He does not have to strain or bear down to start his urinary stream.   He is not having pain in new locations. He does have a good appetite. His bowels are moving normally. He has not seen blood in his stool since the biopsy. He has not recently had unwanted weight loss.   Stroke ~ 10 years ago. No progression of his lower urinary tract symptoms. No recent history infection or hematuria. Patient and I discussed previously performing a prostate biopsy under anesthesia in simultaneously doing a hydrocele repair. He then opted not to have the hydrocele repaired and instead have a prostate biopsy, however it he never schedule this.   Today, the patient is here to discuss hydrocele repair as well. It has progressed.     AUA Symptom Score: He never has the sensation of not emptying his bladder completely after finishing urinating. He never has to urinate again less that two hours after he has finished urinating. He does not have to stop and start again several times when he urinates. He never finds it difficult to postpone urination. Less than 20% of the  time he has a weak urinary stream. He never has to push or strain to begin urination. He has to get up to urinate 1 time from the time he goes to bed until the time he gets up in the morning.   Calculated AUA Symptom Score: 2    QOL Score: He would feel mostly satisfied if he had to live with his urinary condition the way it is now for the rest of his life.   Calculated QOL Symptom Score: 2    ALLERGIES: No Allergies    MEDICATIONS: Alprazolam 0.5 mg tablet Oral  Baclofen 10 mg tablet Oral  Celexa 20 mg tablet  Citalopram Hbr 20 mg tablet Oral  Furosemide 20 mg tablet Oral  Labetalol HCl - 200 MG Oral Tablet Oral  Lisinopril 40 mg tablet Oral  Methylphenidate HCl - 10 MG Oral Tablet Oral  Minoxidil 10 mg tablet Oral  Multi-Day TABS Oral  Naproxen Sodium 550 mg tablet Oral  Simvastatin 40 mg tablet Oral  Triamcinolone Acetonide 0.1 % cream External  ZyrTEC Allergy TABS Oral     GU PSH: None     PSH Notes: Percutaneous Portal Vein Catheter Placement, Interruption Inferior Vena Cava [place] Filter Placement  Hernia Sep 16, 2017   NON-GU PSH: Ins Endovas Vena Cava Filtr - 2013 Insert Catheter; Vein - 2013     GU PMH: Hydrocele - 09/13/2019, - 01/04/2019 Prostate Cancer - 09/13/2019, - 01/04/2019, - 12/17/2018, - 07/14/2018, -  2019, - 2018, - 2018, - 2017, - 2017, Adenocarcinoma of prostate, - 2017 Scrotal edema - 2018 Urge incontinence - 2018 Urinary Urgency - 2018 BPH w/o LUTS - 2017, - 2017 Acute Cystitis/UTI (Improving) - 2017 Hydrocele, Unspec, Hydrocele, left - 2017 Elevated PSA, Elevated prostate specific antigen (PSA) - 2014      PMH Notes:  2012-02-05 13:21:21 - Note: Venous Thrombosis Of The Deep Vessels Of The Lower Extremity   NON-GU PMH: Encounter for general adult medical examination without abnormal findings, Encounter for preventive health examination - 2017 Anxiety, Anxiety (Symptom) - 2014 Personal history of other diseases of the circulatory system,  History of hypertension - 2014 Personal history of other diseases of the digestive system, History of esophageal reflux - 2014 Personal history of other endocrine, nutritional and metabolic disease, History of hypercholesterolemia - 2014 Personal history of other mental and behavioral disorders, History of depression - 2014 Personal history of transient ischemic attack (TIA), and cerebral infarction without residual deficits, History of transient cerebral ischemia - 2014    FAMILY HISTORY: Family History Unknown    SOCIAL HISTORY: Marital Status: Single Preferred Language: English; Ethnicity: Not Hispanic Or Latino; Race: White Current Smoking Status: Patient has never smoked.  Has never drank.  Does not drink caffeine.     Notes: Caffeine Use, Alcohol Use, Marital History - Single, Never A Smoker   REVIEW OF SYSTEMS:    GU Review Male:   Patient reports frequent urination and burning/ pain with urination. Patient denies hard to postpone urination, get up at night to urinate, leakage of urine, stream starts and stops, trouble starting your stream, have to strain to urinate , erection problems, and penile pain.  Gastrointestinal (Upper):   Patient denies nausea, vomiting, and indigestion/ heartburn.  Gastrointestinal (Lower):   Patient denies diarrhea and constipation.  Constitutional:   Patient denies fever, night sweats, weight loss, and fatigue.  Skin:   Patient denies skin rash/ lesion and itching.  Eyes:   Patient denies blurred vision and double vision.  Ears/ Nose/ Throat:   Patient denies sore throat and sinus problems.  Hematologic/Lymphatic:   Patient denies easy bruising and swollen glands.  Cardiovascular:   Patient reports leg swelling. Patient denies chest pains.  Respiratory:   Patient denies cough and shortness of breath.  Endocrine:   Patient denies excessive thirst.  Musculoskeletal:   Patient denies back pain and joint pain.  Neurological:   Patient denies headaches  and dizziness.  Psychologic:   Patient denies depression and anxiety.   VITAL SIGNS:      03/20/2020 01:35 PM  Weight 179 lb / 81.19 kg  Height 68 in / 172.72 cm  BP 108/65 mmHg  Heart Rate 69 /min  Temperature 98.4 F / 36.8 C  BMI 27.2 kg/m   GU PHYSICAL EXAMINATION:    Scrotum: Very large left hemiscrotal swelling consistent previously known hydrocele   MULTI-SYSTEM PHYSICAL EXAMINATION:    Constitutional: Well-nourished. No physical deformities. Normally developed. Good grooming.  Respiratory: Normal breath sounds. No labored breathing, no use of accessory muscles.   Cardiovascular: Regular rate and rhythm. No murmur, no gallop. Normal temperature, normal extremity pulses, no swelling, no varicosities.      Complexity of Data:  Records Review:   Previous Doctor Records, Previous Patient Records, POC Tool  Urine Test Review:   Urinalysis   09/14/19 12/15/18 07/09/18 02/08/18 07/27/17 03/11/17 03/04/17 06/09/16  PSA  Total PSA 6.06 ng/mL 6.07 ng/mL 8.95 ng/mL 5.62 ng/mL 9.02 ng/mL  9.15 ng/dl 10.80 ng/dl 2.91   Free PSA 0.73 ng/mL         % Free PSA 12 % PSA           PROCEDURES:          Urinalysis Dipstick Dipstick Cont'd  Color: YELLOW Bilirubin: NEGATIVE  Appearance: CLEAR Ketones: NEGATIVE  Specific Gravity: 1.015 Blood: NEGATIVE  pH: 7.0 Protein: NEGATIVE  Glucose: NEGATIVE Urobilinogen: 0.2SE.U./dL mg/dL    Nitrites: NEGATIVE    Leukocyte Esterase: NEGATIVE    ASSESSMENT:      ICD-10 Details  1 GU:   Prostate Cancer - C61   2   Hydrocele - N43.0    PLAN:           Orders Labs PSA          Document Letter(s):  Created for Patient: Clinical Summary    I explained the prostate biopsy procedure detail. The patient understands that we will perform a transrectal ultrasound first. This will be followed by numbing the prostate using local anesthesia, and then proceeding with at least a 12 core biopsies. He understands the risk of prostate biopsy which  include approximately 2% chance of developing a systemic infection requiring IV antibiotics. I discussed the small risk of urinary retention. He is also aware that he will likely have blood in his urine and stool for at least 48 hours up to 2 weeks. I also explained that he is likely to have blood in his ejaculate for up to 6 weeks.  Having been given all the information including the risks of prostate cancer as well as the risks of prostate biopsy the patient has agreed to proceed. We'll schedule his biopsy for him at his earliest convenience.        Notes:   The patient and I discussed the risks and the benefits of a hydrocele repair and he has opted to proceed. We have had this discussion several times. He knows about the operation all the details associated with the. Will plan to leave a drain and have him return in 5 days to get removed.

## 2020-04-05 LAB — SURGICAL PATHOLOGY

## 2020-04-05 NOTE — Anesthesia Postprocedure Evaluation (Signed)
Anesthesia Post Note  Patient: Bryan Bartlett  Procedure(s) Performed: BIOPSY TRANSRECTAL ULTRASONIC PROSTATE (TUBP) (N/A Rectum) LEFT HYDROCELECTOMY ADULT (Left Scrotum)     Patient location during evaluation: PACU Anesthesia Type: General Level of consciousness: awake and alert Pain management: pain level controlled Vital Signs Assessment: post-procedure vital signs reviewed and stable Respiratory status: spontaneous breathing, nonlabored ventilation, respiratory function stable and patient connected to nasal cannula oxygen Cardiovascular status: blood pressure returned to baseline and stable Postop Assessment: no apparent nausea or vomiting Anesthetic complications: no    Last Vitals:  Vitals:   04/04/20 1530 04/04/20 1531  BP: 130/76 130/76  Pulse: 81 76  Resp:  16  Temp:  36.7 C  SpO2: 93% 95%    Last Pain:  Vitals:   04/04/20 1531  TempSrc: Oral  PainSc: 0-No pain                 Zaivion Kundrat P Laelah Siravo

## 2021-03-14 DIAGNOSIS — G8194 Hemiplegia, unspecified affecting left nondominant side: Secondary | ICD-10-CM | POA: Diagnosis not present

## 2021-03-14 DIAGNOSIS — N183 Chronic kidney disease, stage 3 unspecified: Secondary | ICD-10-CM | POA: Diagnosis not present

## 2021-03-14 DIAGNOSIS — I129 Hypertensive chronic kidney disease with stage 1 through stage 4 chronic kidney disease, or unspecified chronic kidney disease: Secondary | ICD-10-CM | POA: Diagnosis not present

## 2021-03-14 DIAGNOSIS — K219 Gastro-esophageal reflux disease without esophagitis: Secondary | ICD-10-CM | POA: Diagnosis not present

## 2021-03-14 DIAGNOSIS — E46 Unspecified protein-calorie malnutrition: Secondary | ICD-10-CM | POA: Diagnosis not present

## 2021-03-14 DIAGNOSIS — I69398 Other sequelae of cerebral infarction: Secondary | ICD-10-CM | POA: Diagnosis not present

## 2021-03-14 DIAGNOSIS — E781 Pure hyperglyceridemia: Secondary | ICD-10-CM | POA: Diagnosis not present

## 2021-03-14 DIAGNOSIS — E782 Mixed hyperlipidemia: Secondary | ICD-10-CM | POA: Diagnosis not present

## 2021-03-14 DIAGNOSIS — Z0000 Encounter for general adult medical examination without abnormal findings: Secondary | ICD-10-CM | POA: Diagnosis not present

## 2021-04-16 DIAGNOSIS — I10 Essential (primary) hypertension: Secondary | ICD-10-CM | POA: Diagnosis not present

## 2021-04-16 DIAGNOSIS — G4700 Insomnia, unspecified: Secondary | ICD-10-CM | POA: Diagnosis not present

## 2021-04-16 DIAGNOSIS — I129 Hypertensive chronic kidney disease with stage 1 through stage 4 chronic kidney disease, or unspecified chronic kidney disease: Secondary | ICD-10-CM | POA: Diagnosis not present

## 2021-04-16 DIAGNOSIS — E782 Mixed hyperlipidemia: Secondary | ICD-10-CM | POA: Diagnosis not present

## 2021-04-16 DIAGNOSIS — E781 Pure hyperglyceridemia: Secondary | ICD-10-CM | POA: Diagnosis not present

## 2021-04-16 DIAGNOSIS — K219 Gastro-esophageal reflux disease without esophagitis: Secondary | ICD-10-CM | POA: Diagnosis not present

## 2021-07-10 DIAGNOSIS — N183 Chronic kidney disease, stage 3 unspecified: Secondary | ICD-10-CM | POA: Diagnosis not present

## 2021-07-10 DIAGNOSIS — K219 Gastro-esophageal reflux disease without esophagitis: Secondary | ICD-10-CM | POA: Diagnosis not present

## 2021-07-10 DIAGNOSIS — E781 Pure hyperglyceridemia: Secondary | ICD-10-CM | POA: Diagnosis not present

## 2021-07-10 DIAGNOSIS — G4700 Insomnia, unspecified: Secondary | ICD-10-CM | POA: Diagnosis not present

## 2021-07-10 DIAGNOSIS — I10 Essential (primary) hypertension: Secondary | ICD-10-CM | POA: Diagnosis not present

## 2021-07-10 DIAGNOSIS — E782 Mixed hyperlipidemia: Secondary | ICD-10-CM | POA: Diagnosis not present

## 2021-07-10 DIAGNOSIS — I129 Hypertensive chronic kidney disease with stage 1 through stage 4 chronic kidney disease, or unspecified chronic kidney disease: Secondary | ICD-10-CM | POA: Diagnosis not present

## 2021-10-31 DIAGNOSIS — R609 Edema, unspecified: Secondary | ICD-10-CM | POA: Diagnosis not present

## 2021-10-31 DIAGNOSIS — E782 Mixed hyperlipidemia: Secondary | ICD-10-CM | POA: Diagnosis not present

## 2021-10-31 DIAGNOSIS — E46 Unspecified protein-calorie malnutrition: Secondary | ICD-10-CM | POA: Diagnosis not present

## 2021-10-31 DIAGNOSIS — G4700 Insomnia, unspecified: Secondary | ICD-10-CM | POA: Diagnosis not present

## 2021-10-31 DIAGNOSIS — I129 Hypertensive chronic kidney disease with stage 1 through stage 4 chronic kidney disease, or unspecified chronic kidney disease: Secondary | ICD-10-CM | POA: Diagnosis not present

## 2021-10-31 DIAGNOSIS — G8194 Hemiplegia, unspecified affecting left nondominant side: Secondary | ICD-10-CM | POA: Diagnosis not present

## 2021-10-31 DIAGNOSIS — M79606 Pain in leg, unspecified: Secondary | ICD-10-CM | POA: Diagnosis not present

## 2021-10-31 DIAGNOSIS — I69398 Other sequelae of cerebral infarction: Secondary | ICD-10-CM | POA: Diagnosis not present

## 2021-10-31 DIAGNOSIS — N1831 Chronic kidney disease, stage 3a: Secondary | ICD-10-CM | POA: Diagnosis not present

## 2022-03-20 DIAGNOSIS — I69398 Other sequelae of cerebral infarction: Secondary | ICD-10-CM | POA: Diagnosis not present

## 2022-03-20 DIAGNOSIS — E46 Unspecified protein-calorie malnutrition: Secondary | ICD-10-CM | POA: Diagnosis not present

## 2022-03-20 DIAGNOSIS — N1831 Chronic kidney disease, stage 3a: Secondary | ICD-10-CM | POA: Diagnosis not present

## 2022-03-20 DIAGNOSIS — Z0000 Encounter for general adult medical examination without abnormal findings: Secondary | ICD-10-CM | POA: Diagnosis not present

## 2022-03-20 DIAGNOSIS — I129 Hypertensive chronic kidney disease with stage 1 through stage 4 chronic kidney disease, or unspecified chronic kidney disease: Secondary | ICD-10-CM | POA: Diagnosis not present

## 2022-03-20 DIAGNOSIS — E782 Mixed hyperlipidemia: Secondary | ICD-10-CM | POA: Diagnosis not present

## 2022-03-20 DIAGNOSIS — G8194 Hemiplegia, unspecified affecting left nondominant side: Secondary | ICD-10-CM | POA: Diagnosis not present

## 2022-03-20 DIAGNOSIS — Z23 Encounter for immunization: Secondary | ICD-10-CM | POA: Diagnosis not present

## 2022-12-03 DIAGNOSIS — E782 Mixed hyperlipidemia: Secondary | ICD-10-CM | POA: Diagnosis not present

## 2022-12-03 DIAGNOSIS — Z23 Encounter for immunization: Secondary | ICD-10-CM | POA: Diagnosis not present

## 2022-12-03 DIAGNOSIS — I69319 Unspecified symptoms and signs involving cognitive functions following cerebral infarction: Secondary | ICD-10-CM | POA: Diagnosis not present

## 2022-12-03 DIAGNOSIS — I10 Essential (primary) hypertension: Secondary | ICD-10-CM | POA: Diagnosis not present

## 2022-12-03 DIAGNOSIS — Z1211 Encounter for screening for malignant neoplasm of colon: Secondary | ICD-10-CM | POA: Diagnosis not present

## 2022-12-03 DIAGNOSIS — Z8673 Personal history of transient ischemic attack (TIA), and cerebral infarction without residual deficits: Secondary | ICD-10-CM | POA: Diagnosis not present

## 2022-12-03 DIAGNOSIS — I69354 Hemiplegia and hemiparesis following cerebral infarction affecting left non-dominant side: Secondary | ICD-10-CM | POA: Diagnosis not present

## 2022-12-03 DIAGNOSIS — R609 Edema, unspecified: Secondary | ICD-10-CM | POA: Diagnosis not present

## 2023-04-30 DIAGNOSIS — R609 Edema, unspecified: Secondary | ICD-10-CM | POA: Diagnosis not present

## 2023-04-30 DIAGNOSIS — I69354 Hemiplegia and hemiparesis following cerebral infarction affecting left non-dominant side: Secondary | ICD-10-CM | POA: Diagnosis not present

## 2023-04-30 DIAGNOSIS — I69319 Unspecified symptoms and signs involving cognitive functions following cerebral infarction: Secondary | ICD-10-CM | POA: Diagnosis not present

## 2023-04-30 DIAGNOSIS — Z0000 Encounter for general adult medical examination without abnormal findings: Secondary | ICD-10-CM | POA: Diagnosis not present

## 2023-04-30 DIAGNOSIS — Z1211 Encounter for screening for malignant neoplasm of colon: Secondary | ICD-10-CM | POA: Diagnosis not present

## 2023-04-30 DIAGNOSIS — E782 Mixed hyperlipidemia: Secondary | ICD-10-CM | POA: Diagnosis not present

## 2023-04-30 DIAGNOSIS — Z8673 Personal history of transient ischemic attack (TIA), and cerebral infarction without residual deficits: Secondary | ICD-10-CM | POA: Diagnosis not present

## 2024-03-22 ENCOUNTER — Other Ambulatory Visit: Payer: Self-pay | Admitting: Urology

## 2024-03-22 DIAGNOSIS — C61 Malignant neoplasm of prostate: Secondary | ICD-10-CM
